# Patient Record
Sex: Female | Born: 1960 | Race: White | Hispanic: No | Marital: Married | Smoking: Never smoker
Health system: Southern US, Community
[De-identification: ages and names within clinical notes are randomized; demographics above are authoritative.]

## PROBLEM LIST (undated history)

## (undated) DIAGNOSIS — G43809 Other migraine, not intractable, without status migrainosus: Secondary | ICD-10-CM

## (undated) DIAGNOSIS — E785 Hyperlipidemia, unspecified: Secondary | ICD-10-CM

## (undated) DIAGNOSIS — C50919 Malignant neoplasm of unspecified site of unspecified female breast: Secondary | ICD-10-CM

## (undated) DIAGNOSIS — M810 Age-related osteoporosis without current pathological fracture: Secondary | ICD-10-CM

## (undated) DIAGNOSIS — G894 Chronic pain syndrome: Secondary | ICD-10-CM

## (undated) DIAGNOSIS — M5030 Other cervical disc degeneration, unspecified cervical region: Secondary | ICD-10-CM

## (undated) DIAGNOSIS — E063 Autoimmune thyroiditis: Secondary | ICD-10-CM

## (undated) DIAGNOSIS — K219 Gastro-esophageal reflux disease without esophagitis: Secondary | ICD-10-CM

## (undated) DIAGNOSIS — K589 Irritable bowel syndrome without diarrhea: Secondary | ICD-10-CM

## (undated) DIAGNOSIS — E538 Deficiency of other specified B group vitamins: Secondary | ICD-10-CM

## (undated) DIAGNOSIS — E079 Disorder of thyroid, unspecified: Secondary | ICD-10-CM

## (undated) DIAGNOSIS — E559 Vitamin D deficiency, unspecified: Secondary | ICD-10-CM

## (undated) DIAGNOSIS — E7800 Pure hypercholesterolemia, unspecified: Secondary | ICD-10-CM

## (undated) DIAGNOSIS — M797 Fibromyalgia: Secondary | ICD-10-CM

## (undated) DIAGNOSIS — C801 Malignant (primary) neoplasm, unspecified: Secondary | ICD-10-CM

## (undated) HISTORY — DX: Autoimmune thyroiditis: E06.3

## (undated) HISTORY — PX: SALPINGOOPHORECTOMY: SHX82

## (undated) HISTORY — DX: Fibromyalgia: M79.7

## (undated) HISTORY — PX: LEG SURGERY: SHX1003

## (undated) HISTORY — DX: Malignant neoplasm of unspecified site of unspecified female breast: C50.919

## (undated) HISTORY — DX: Gastro-esophageal reflux disease without esophagitis: K21.9

## (undated) HISTORY — DX: Other migraine, not intractable, without status migrainosus: G43.809

## (undated) HISTORY — DX: Malignant (primary) neoplasm, unspecified: C80.1

## (undated) HISTORY — DX: Irritable bowel syndrome, unspecified: K58.9

## (undated) HISTORY — DX: Age-related osteoporosis without current pathological fracture: M81.0

## (undated) HISTORY — DX: Chronic pain syndrome: G89.4

## (undated) HISTORY — PX: DILATATION AND CURETTAGE/HYSTEROSCOPY WITH MINERVA: SHX6851

## (undated) HISTORY — DX: Other cervical disc degeneration, unspecified cervical region: M50.30

## (undated) HISTORY — PX: ABDOMINAL HYSTERECTOMY: SHX81

## (undated) HISTORY — DX: Hyperlipidemia, unspecified: E78.5

## (undated) HISTORY — DX: Deficiency of other specified B group vitamins: E53.8

## (undated) HISTORY — DX: Disorder of thyroid, unspecified: E07.9

## (undated) HISTORY — PX: REPLACEMENT TOTAL KNEE: SUR1224

## (undated) HISTORY — DX: Vitamin D deficiency, unspecified: E55.9

## (undated) HISTORY — DX: Pure hypercholesterolemia, unspecified: E78.00

---

## 2006-12-13 DIAGNOSIS — M1990 Unspecified osteoarthritis, unspecified site: Secondary | ICD-10-CM | POA: Insufficient documentation

## 2006-12-13 HISTORY — DX: Unspecified osteoarthritis, unspecified site: M19.90

## 2007-11-17 DIAGNOSIS — C50919 Malignant neoplasm of unspecified site of unspecified female breast: Secondary | ICD-10-CM | POA: Insufficient documentation

## 2007-11-17 HISTORY — PX: MASTECTOMY: SHX3

## 2007-11-17 HISTORY — DX: Malignant neoplasm of unspecified site of unspecified female breast: C50.919

## 2007-11-17 HISTORY — PX: ABDOMINAL HYSTERECTOMY: SHX81

## 2007-11-22 DIAGNOSIS — Z1502 Genetic susceptibility to malignant neoplasm of ovary: Secondary | ICD-10-CM | POA: Insufficient documentation

## 2007-11-22 DIAGNOSIS — Z1501 Genetic susceptibility to malignant neoplasm of breast: Secondary | ICD-10-CM

## 2007-11-22 DIAGNOSIS — C50911 Malignant neoplasm of unspecified site of right female breast: Secondary | ICD-10-CM

## 2007-11-22 DIAGNOSIS — C50912 Malignant neoplasm of unspecified site of left female breast: Secondary | ICD-10-CM

## 2007-11-22 DIAGNOSIS — C50919 Malignant neoplasm of unspecified site of unspecified female breast: Secondary | ICD-10-CM | POA: Insufficient documentation

## 2007-11-22 HISTORY — DX: Genetic susceptibility to malignant neoplasm of breast: Z15.01

## 2007-11-22 HISTORY — DX: Malignant neoplasm of unspecified site of right female breast: C50.911

## 2007-11-22 HISTORY — DX: Malignant neoplasm of unspecified site of left female breast: C50.912

## 2008-04-25 DIAGNOSIS — G629 Polyneuropathy, unspecified: Secondary | ICD-10-CM

## 2008-04-25 HISTORY — DX: Polyneuropathy, unspecified: G62.9

## 2008-09-12 DIAGNOSIS — K589 Irritable bowel syndrome without diarrhea: Secondary | ICD-10-CM | POA: Insufficient documentation

## 2008-09-16 DIAGNOSIS — M797 Fibromyalgia: Secondary | ICD-10-CM | POA: Insufficient documentation

## 2008-11-16 HISTORY — PX: BREAST RECONSTRUCTION: SHX9

## 2009-01-14 DIAGNOSIS — M51369 Other intervertebral disc degeneration, lumbar region without mention of lumbar back pain or lower extremity pain: Secondary | ICD-10-CM

## 2009-01-14 DIAGNOSIS — M5136 Other intervertebral disc degeneration, lumbar region: Secondary | ICD-10-CM | POA: Insufficient documentation

## 2009-01-14 HISTORY — DX: Other intervertebral disc degeneration, lumbar region without mention of lumbar back pain or lower extremity pain: M51.369

## 2009-02-10 DIAGNOSIS — K219 Gastro-esophageal reflux disease without esophagitis: Secondary | ICD-10-CM | POA: Insufficient documentation

## 2011-09-17 DIAGNOSIS — G4486 Cervicogenic headache: Secondary | ICD-10-CM

## 2011-09-17 DIAGNOSIS — G43809 Other migraine, not intractable, without status migrainosus: Secondary | ICD-10-CM

## 2011-09-17 HISTORY — DX: Other migraine, not intractable, without status migrainosus: G43.809

## 2011-09-17 HISTORY — DX: Cervicogenic headache: G44.86

## 2012-02-11 DIAGNOSIS — Z860100 Personal history of colon polyps, unspecified: Secondary | ICD-10-CM

## 2012-02-11 HISTORY — DX: Personal history of colon polyps, unspecified: Z86.0100

## 2018-03-24 DIAGNOSIS — E063 Autoimmune thyroiditis: Secondary | ICD-10-CM | POA: Insufficient documentation

## 2018-12-13 DIAGNOSIS — L989 Disorder of the skin and subcutaneous tissue, unspecified: Secondary | ICD-10-CM

## 2018-12-13 HISTORY — DX: Disorder of the skin and subcutaneous tissue, unspecified: L98.9

## 2021-02-21 DIAGNOSIS — G894 Chronic pain syndrome: Secondary | ICD-10-CM | POA: Diagnosis not present

## 2021-02-21 DIAGNOSIS — E038 Other specified hypothyroidism: Secondary | ICD-10-CM | POA: Diagnosis not present

## 2021-02-21 DIAGNOSIS — C50919 Malignant neoplasm of unspecified site of unspecified female breast: Secondary | ICD-10-CM | POA: Diagnosis not present

## 2021-02-21 DIAGNOSIS — E559 Vitamin D deficiency, unspecified: Secondary | ICD-10-CM | POA: Diagnosis not present

## 2021-02-21 DIAGNOSIS — E538 Deficiency of other specified B group vitamins: Secondary | ICD-10-CM | POA: Diagnosis not present

## 2021-02-21 DIAGNOSIS — M797 Fibromyalgia: Secondary | ICD-10-CM | POA: Diagnosis not present

## 2021-02-21 DIAGNOSIS — E063 Autoimmune thyroiditis: Secondary | ICD-10-CM | POA: Diagnosis not present

## 2021-02-21 DIAGNOSIS — E785 Hyperlipidemia, unspecified: Secondary | ICD-10-CM | POA: Diagnosis not present

## 2021-02-21 DIAGNOSIS — Z79899 Other long term (current) drug therapy: Secondary | ICD-10-CM | POA: Diagnosis not present

## 2021-03-23 DIAGNOSIS — Z0100 Encounter for examination of eyes and vision without abnormal findings: Secondary | ICD-10-CM | POA: Diagnosis not present

## 2021-03-26 DIAGNOSIS — E038 Other specified hypothyroidism: Secondary | ICD-10-CM | POA: Diagnosis not present

## 2021-03-26 DIAGNOSIS — Z1211 Encounter for screening for malignant neoplasm of colon: Secondary | ICD-10-CM | POA: Diagnosis not present

## 2021-03-26 DIAGNOSIS — E063 Autoimmune thyroiditis: Secondary | ICD-10-CM | POA: Diagnosis not present

## 2021-03-26 DIAGNOSIS — Z01419 Encounter for gynecological examination (general) (routine) without abnormal findings: Secondary | ICD-10-CM | POA: Diagnosis not present

## 2021-03-26 DIAGNOSIS — Z6838 Body mass index (BMI) 38.0-38.9, adult: Secondary | ICD-10-CM | POA: Diagnosis not present

## 2021-04-01 DIAGNOSIS — M5136 Other intervertebral disc degeneration, lumbar region: Secondary | ICD-10-CM | POA: Diagnosis not present

## 2021-04-01 DIAGNOSIS — M797 Fibromyalgia: Secondary | ICD-10-CM | POA: Diagnosis not present

## 2021-04-01 DIAGNOSIS — M5030 Other cervical disc degeneration, unspecified cervical region: Secondary | ICD-10-CM | POA: Diagnosis not present

## 2021-04-01 DIAGNOSIS — G894 Chronic pain syndrome: Secondary | ICD-10-CM | POA: Diagnosis not present

## 2021-04-01 DIAGNOSIS — M549 Dorsalgia, unspecified: Secondary | ICD-10-CM | POA: Diagnosis not present

## 2021-04-01 DIAGNOSIS — Z1389 Encounter for screening for other disorder: Secondary | ICD-10-CM | POA: Diagnosis not present

## 2021-04-18 DIAGNOSIS — G894 Chronic pain syndrome: Secondary | ICD-10-CM | POA: Diagnosis not present

## 2021-04-18 DIAGNOSIS — M5136 Other intervertebral disc degeneration, lumbar region: Secondary | ICD-10-CM | POA: Diagnosis not present

## 2021-04-18 DIAGNOSIS — M549 Dorsalgia, unspecified: Secondary | ICD-10-CM | POA: Diagnosis not present

## 2021-04-18 DIAGNOSIS — M797 Fibromyalgia: Secondary | ICD-10-CM | POA: Diagnosis not present

## 2021-04-18 DIAGNOSIS — M5030 Other cervical disc degeneration, unspecified cervical region: Secondary | ICD-10-CM | POA: Diagnosis not present

## 2021-04-18 DIAGNOSIS — Z1389 Encounter for screening for other disorder: Secondary | ICD-10-CM | POA: Diagnosis not present

## 2021-06-23 DIAGNOSIS — M549 Dorsalgia, unspecified: Secondary | ICD-10-CM | POA: Diagnosis not present

## 2021-06-23 DIAGNOSIS — M797 Fibromyalgia: Secondary | ICD-10-CM | POA: Diagnosis not present

## 2021-06-23 DIAGNOSIS — Z1389 Encounter for screening for other disorder: Secondary | ICD-10-CM | POA: Diagnosis not present

## 2021-06-23 DIAGNOSIS — G894 Chronic pain syndrome: Secondary | ICD-10-CM | POA: Diagnosis not present

## 2021-06-23 DIAGNOSIS — M5030 Other cervical disc degeneration, unspecified cervical region: Secondary | ICD-10-CM | POA: Diagnosis not present

## 2021-06-23 DIAGNOSIS — M5136 Other intervertebral disc degeneration, lumbar region: Secondary | ICD-10-CM | POA: Diagnosis not present

## 2021-06-26 DIAGNOSIS — E063 Autoimmune thyroiditis: Secondary | ICD-10-CM | POA: Diagnosis not present

## 2021-06-26 DIAGNOSIS — E038 Other specified hypothyroidism: Secondary | ICD-10-CM | POA: Diagnosis not present

## 2021-06-26 DIAGNOSIS — Z6836 Body mass index (BMI) 36.0-36.9, adult: Secondary | ICD-10-CM | POA: Diagnosis not present

## 2021-06-26 DIAGNOSIS — K580 Irritable bowel syndrome with diarrhea: Secondary | ICD-10-CM | POA: Diagnosis not present

## 2021-07-07 ENCOUNTER — Ambulatory Visit (INDEPENDENT_AMBULATORY_CARE_PROVIDER_SITE_OTHER): Payer: Medicare HMO | Admitting: Endocrinology

## 2021-07-07 ENCOUNTER — Other Ambulatory Visit: Payer: Self-pay

## 2021-07-07 ENCOUNTER — Encounter: Payer: Self-pay | Admitting: Endocrinology

## 2021-07-07 DIAGNOSIS — E042 Nontoxic multinodular goiter: Secondary | ICD-10-CM

## 2021-07-07 DIAGNOSIS — E039 Hypothyroidism, unspecified: Secondary | ICD-10-CM

## 2021-07-07 HISTORY — DX: Hypothyroidism, unspecified: E03.9

## 2021-07-07 HISTORY — DX: Nontoxic multinodular goiter: E04.2

## 2021-07-07 LAB — T4, FREE: Free T4: 1.36 ng/dL (ref 0.60–1.60)

## 2021-07-07 LAB — TSH: TSH: 0.01 u[IU]/mL — ABNORMAL LOW (ref 0.35–5.50)

## 2021-07-07 MED ORDER — LEVOTHYROXINE SODIUM 112 MCG PO TABS: 112.0000 ug | ORAL_TABLET | Freq: Every day | ORAL | 3 refills | Status: DC

## 2021-07-07 MED ORDER — LEVOTHYROXINE SODIUM 137 MCG PO TABS: 137.0000 ug | ORAL_TABLET | Freq: Every day | ORAL | 3 refills | Status: DC

## 2021-07-07 NOTE — Patient Instructions (Addendum)
Blood tests are requested for you today.  We'll let you know about the results.   Please sign a release of information from the doctor in [facility].   Please come back for a follow-up appointment in 1 year.

## 2021-07-07 NOTE — Progress Notes (Signed)
Subjective:    Patient ID: Laura Hill, female    DOB: 24-Aug-1961, 60 y.o.   MRN: [id number]  HPI Pt is referred by Dr Aleda Grana, for hypothyroidism.  Pt reports hypothyroidism was dx'ed in 2012.  she has been on prescribed thyroid hormone therapy since dx.  she has never taken kelp or any other type of non-prescribed thyroid product. she has never had thyroid surgery, or XRT to the neck.  she has never been on amiodarone or lithium.  Pt says she had bx of 2 nodules in approx 2017, in [place].  She reports fatigue and alt C and D.  She has been on her current 137 mcg/d x 2 mos.  Since the increase, pt states she feels no different.   Past Medical History:  Diagnosis Date   Cancer ([place])    Osteoporosis    Thyroid disease    Social History   Socioeconomic History   Marital status: Married    Spouse name: Not on file   Number of children: Not on file   Years of education: Not on file   Highest education level: Not on file  Occupational History   Not on file  Tobacco Use   Smoking status: Never   Smokeless tobacco: Never  Substance and Sexual Activity   Alcohol use: Never   Drug use: Never   Sexual activity: Not on file  Other Topics Concern   Not on file  Social History Narrative   Not on file   Social Determinants of Health   Financial Resource Strain: Not on file  Food Insecurity: Not on file  Transportation Needs: Not on file  Physical Activity: Not on file  Stress: Not on file  Social Connections: Not on file  Intimate Partner Violence: Not on file    Current Outpatient Medications on File Prior to Visit  Medication Sig Dispense Refill   dicyclomine (BENTYL) 20 MG tablet      DULoxetine (CYMBALTA) 60 MG capsule      linaCLOtide (LINZESS PO) Take by mouth.     omeprazole (PRILOSEC) 40 MG capsule      pregabalin (LYRICA) 100 MG capsule      rosuvastatin (CRESTOR) 20 MG tablet      tiZANidine (ZANAFLEX) 4 MG tablet      No current facility-administered medications on  file prior to visit.    Allergies  Allergen Reactions   Bactrim [Sulfamethoxazole-Trimethoprim] Hives    No family history on file.  BP 122/86   Pulse 92   Ht '5\' 6"'$  (1.676 m)   Wt 237 lb (107.5 kg)   SpO2 98%   BMI 38.25 kg/m     Review of Systems denies depression, numbness, cold intolerance, and dry skin.  She has lost 16 lbs x a few mos.      Objective:   Physical Exam VS: see vs page GEN: no distress HEAD: head: no deformity eyes: no periorbital swelling, no proptosis external nose and ears are normal NECK: supple, thyroid is not enlarged CHEST WALL: no deformity LUNGS: clear to auscultation CV: reg rate and rhythm, no murmur.  MUSCULOSKELETAL: gait is normal and steady EXTEMITIES: no deformity.  no leg edema NEURO:  readily moves all 4's.  sensation is intact to touch on all 4's SKIN:  Normal texture and temperature.  No rash or suspicious lesion is visible.   NODES:  None palpable at the neck.   PSYCH: alert, well-oriented.  Does not appear anxious nor depressed.  Lab Results  Component Value Date   TSH 0.01 Repeated and verified X2. (L) 07/07/2021       Assessment & Plan:  Hypothyroidism, overcontrolled.  Reduce synthroid MNG: records are requested  Patient Instructions  Blood tests are requested for you today.  We'll let you know about the results.   Please sign a release of information from the doctor in [facility].   Please come back for a follow-up appointment in 1 year.

## 2021-07-14 DIAGNOSIS — K219 Gastro-esophageal reflux disease without esophagitis: Secondary | ICD-10-CM | POA: Diagnosis not present

## 2021-07-14 DIAGNOSIS — Z6836 Body mass index (BMI) 36.0-36.9, adult: Secondary | ICD-10-CM | POA: Diagnosis not present

## 2021-07-14 DIAGNOSIS — E063 Autoimmune thyroiditis: Secondary | ICD-10-CM | POA: Diagnosis not present

## 2021-07-14 DIAGNOSIS — M797 Fibromyalgia: Secondary | ICD-10-CM | POA: Diagnosis not present

## 2021-07-14 DIAGNOSIS — E785 Hyperlipidemia, unspecified: Secondary | ICD-10-CM | POA: Diagnosis not present

## 2021-07-14 DIAGNOSIS — E038 Other specified hypothyroidism: Secondary | ICD-10-CM | POA: Diagnosis not present

## 2021-07-14 DIAGNOSIS — K580 Irritable bowel syndrome with diarrhea: Secondary | ICD-10-CM | POA: Diagnosis not present

## 2021-08-21 DIAGNOSIS — M5416 Radiculopathy, lumbar region: Secondary | ICD-10-CM | POA: Diagnosis not present

## 2021-08-21 DIAGNOSIS — M797 Fibromyalgia: Secondary | ICD-10-CM | POA: Diagnosis not present

## 2021-08-21 DIAGNOSIS — G894 Chronic pain syndrome: Secondary | ICD-10-CM | POA: Diagnosis not present

## 2021-08-21 DIAGNOSIS — M5030 Other cervical disc degeneration, unspecified cervical region: Secondary | ICD-10-CM | POA: Diagnosis not present

## 2021-08-21 DIAGNOSIS — M5136 Other intervertebral disc degeneration, lumbar region: Secondary | ICD-10-CM | POA: Diagnosis not present

## 2021-10-13 ENCOUNTER — Encounter: Payer: Self-pay | Admitting: Endocrinology

## 2021-10-14 DIAGNOSIS — E040 Nontoxic diffuse goiter: Secondary | ICD-10-CM | POA: Diagnosis not present

## 2021-10-14 DIAGNOSIS — R509 Fever, unspecified: Secondary | ICD-10-CM | POA: Diagnosis not present

## 2021-10-14 DIAGNOSIS — I75023 Atheroembolism of bilateral lower extremities: Secondary | ICD-10-CM | POA: Diagnosis not present

## 2021-10-14 DIAGNOSIS — E038 Other specified hypothyroidism: Secondary | ICD-10-CM | POA: Diagnosis not present

## 2021-10-14 DIAGNOSIS — C50919 Malignant neoplasm of unspecified site of unspecified female breast: Secondary | ICD-10-CM | POA: Diagnosis not present

## 2021-10-14 DIAGNOSIS — E063 Autoimmune thyroiditis: Secondary | ICD-10-CM | POA: Diagnosis not present

## 2021-10-14 DIAGNOSIS — Z6836 Body mass index (BMI) 36.0-36.9, adult: Secondary | ICD-10-CM | POA: Diagnosis not present

## 2021-10-14 DIAGNOSIS — R590 Localized enlarged lymph nodes: Secondary | ICD-10-CM | POA: Diagnosis not present

## 2021-10-15 ENCOUNTER — Ambulatory Visit (INDEPENDENT_AMBULATORY_CARE_PROVIDER_SITE_OTHER): Payer: Medicare HMO | Admitting: Gastroenterology

## 2021-10-15 ENCOUNTER — Encounter: Payer: Self-pay | Admitting: Gastroenterology

## 2021-10-15 ENCOUNTER — Telehealth: Payer: Self-pay

## 2021-10-15 VITALS — BP 124/88 | HR 87 | Ht 66.0 in | Wt 237.5 lb

## 2021-10-15 DIAGNOSIS — Z800 Family history of malignant neoplasm of digestive organs: Secondary | ICD-10-CM | POA: Diagnosis not present

## 2021-10-15 DIAGNOSIS — K589 Irritable bowel syndrome without diarrhea: Secondary | ICD-10-CM

## 2021-10-15 DIAGNOSIS — Z8719 Personal history of other diseases of the digestive system: Secondary | ICD-10-CM | POA: Diagnosis not present

## 2021-10-15 DIAGNOSIS — R634 Abnormal weight loss: Secondary | ICD-10-CM

## 2021-10-15 DIAGNOSIS — R1031 Right lower quadrant pain: Secondary | ICD-10-CM | POA: Diagnosis not present

## 2021-10-15 DIAGNOSIS — R1032 Left lower quadrant pain: Secondary | ICD-10-CM | POA: Diagnosis not present

## 2021-10-15 DIAGNOSIS — K219 Gastro-esophageal reflux disease without esophagitis: Secondary | ICD-10-CM | POA: Diagnosis not present

## 2021-10-15 DIAGNOSIS — R1310 Dysphagia, unspecified: Secondary | ICD-10-CM

## 2021-10-15 NOTE — Patient Instructions (Addendum)
If you are age 60 or older, your body mass index should be between 23-30. Your Body mass index is 38.33 kg/m. If this is out of the aforementioned range listed, please consider follow up with your Primary Care Provider.  If you are age 2 or younger, your body mass index should be between 19-25. Your Body mass index is 38.33 kg/m. If this is out of the aformentioned range listed, please consider follow up with your Primary Care Provider.   ________________________________________________________  The [facility] providers would like to encourage you to use [facility] to communicate with providers for non-urgent requests or questions.  Due to long hold times on the telephone, sending your provider a message by [facility] may be a faster and more efficient way to get a response.  Please allow 48 business hours for a response.  Please remember that this is for non-urgent requests.  _______________________________________________________  Dennis Bast have been scheduled for an endoscopy and colonoscopy. Please follow the written instructions given to you at your visit today. Please pick up your prep supplies at the pharmacy within the next 1-3 days. If you use inhalers (even only as needed), please bring them with you on the day of your procedure.  Two days before your procedure: Mix 3 packs (or capfuls) of Miralax in 48 ounces of clear liquid and drink at 6pm.  Please go to the lab on the 2nd floor [address] before you leave the office today.  Continue omeprazole   Call in 3 days to schedule your CT You have been scheduled for a CT scan of the abdomen and pelvis at [facility] [place], [place] [address] [address] Radiology).   You are scheduled on           at            . You should arrive 15 minutes prior to your appointment time for registration. Please follow the written instructions below on the day of your exam:  WARNING: IF YOU ARE ALLERGIC TO IODINE/X-RAY DYE, PLEASE  NOTIFY RADIOLOGY IMMEDIATELY AT [phone]! YOU WILL BE GIVEN A 13 HOUR PREMEDICATION PREP.  1) Do not eat or drink anything after          (4 hours prior to your test) 2) You have been given 2 bottles of oral contrast to drink. The solution may taste better if refrigerated, but do NOT add ice or any other liquid to this solution. Shake well before drinking.    Drink 1 bottle of contrast @           (2 hours prior to your exam)  Drink 1 bottle of contrast @           (1 hour prior to your exam)  You may take any medications as prescribed with a small amount of water, if necessary. If you take any of the following medications: METFORMIN, GLUCOPHAGE, GLUCOVANCE, AVANDAMET, RIOMET, FORTAMET, [place] MET, JANUMET, GLUMETZA or METAGLIP, you MAY be asked to HOLD this medication 48 hours AFTER the exam.  The purpose of you drinking the oral contrast is to aid in the visualization of your intestinal tract. The contrast solution may cause some diarrhea. Depending on your individual set of symptoms, you may also receive an intravenous injection of x-ray contrast/dye. Plan on being at [facility] for 30 minutes or longer, depending on the type of exam you are having performed.  This test typically takes 30-45 minutes to complete.  If  you have any questions regarding your exam or if you need to reschedule, you may call the CT department at [phone] between the hours of 8:00 am and 5:00 pm, Monday-Friday.  ________________________________________________________________________  Thank you,  Dr. Jackquline Denmark

## 2021-10-15 NOTE — Telephone Encounter (Signed)
CT has been authorized and patient is aware she can call now to schedule the CT

## 2021-10-15 NOTE — Progress Notes (Signed)
Chief Complaint:   Referring Provider:  [name], MD      ASSESSMENT AND PLAN;   #1. GERD with H/O HH with occ dysphagia. Neg prev Bx for EoE/Barrett's.  #2. IBS with alternating diarrhea and constipation.  #3. FH colon Ca (mom at age 60). H/O colonic polyps.   #4. RLQ/LLQ pain with wt loss   Plan: -CBC, CMP -CT AP with p.o. and IV contrast -EGD with dil/colon in Jan/feb 2023 -Continue omeprazole 40 mg p.o. once a day -Non-pharmacologic means of reflux control.   I discussed EGD/Colonoscopy- the indications, risks, alternatives and potential complications including, but not limited to, bleeding, infection, reaction to medication, damage to internal organs, cardiac and/or pulmonary problems, and perforation requiring surgery (1 to 2 in 1000). The possibility that significant findings could be missed was explained. All ? were answered. The patient gives consent to proceed. HPI:    Laura Hill is a 60 y.o. female  With history of breast cancer (BRCA 2+, s/p B/L mastectomy, hysterectomy), fibromyalgia, GERD, IC, IBS, obesity, HLD, hypothyroidism, chronic headaches, OSA,   C/O IBS Dx 2009, alt diarrhea and constipation with bloating.  When she gets constipated she will take Linzess 290 mcg.she rarely uses Imodium.  Has associated lower abdominal pain which gets better with defecation.  Recently had wt loss 20lb over 1 year.  Occ rectal bleeding d/t hoids.  Better with Preparation H.  Longstanding history of gastroesophageal reflux, better with omeprazole.  She does have intermittent solid food dysphagia.  Had previous several esophageal dilatations as below.  Would like to get dilation performed  Family history of colon cancer -Mom at age 33, [place] at 59, MM  Braca2 +.  Lynch was negative.  Past GI work-up (Dr Kendrick Fries at [facility]): Colonoscopy 11/27/2018 ([facility]): Mild diverticulosis, grade 1 internal hemorrhoids, strong family history of colon  cancer.  Repeat in 3 years. Colonoscopy 07/13/2017 grade 2 internal hemorrhoids s/p banding, mild diverticulosis, neg colon biopsies. Colonoscopy 09/03/2015 mild diverticulosis, internal hemorrhoids. Colonoscopy 02/2013 8 mm cecal polyp tubulovillous adenoma, ascending colon 6 mm polyp tubular adenoma. EGD 07/13/2017: Mild gastritis, fundic gland polyps.  Dilation empiric 54Fr (better) EGD 08/24/2015: Empiric 54 Fr, no Barrett's or EOE.  No obvious hiatal hernia. EGD 02/22/2013 LA grade a EE, 54Fr Past Medical History:  Diagnosis Date   Breast cancer ([place])    Cancer ([place])    Osteoporosis    Thyroid disease     History reviewed. No pertinent surgical history.  Family History  Problem Relation Age of Onset   Colon cancer Mother    Multiple myeloma Mother    Prostate cancer Father    Colon cancer Maternal Uncle    Breast cancer Paternal Aunt    Breast cancer Paternal Uncle    Stomach cancer Paternal Uncle    Breast cancer Paternal Grandmother     Social History   Tobacco Use   Smoking status: Never   Smokeless tobacco: Never  Vaping Use   Vaping Use: Never used  Substance Use Topics   Alcohol use: Never   Drug use: Never    Current Outpatient Medications  Medication Sig Dispense Refill   dicyclomine (BENTYL) 20 MG tablet      DULoxetine (CYMBALTA) 60 MG capsule      levothyroxine (SYNTHROID) 112 MCG tablet Take 1 tablet (112 mcg total) by mouth daily. 90 tablet 3   linaCLOtide (LINZESS PO) Take by mouth.     omeprazole (PRILOSEC)  40 MG capsule      pregabalin (LYRICA) 100 MG capsule      rosuvastatin (CRESTOR) 20 MG tablet      tiZANidine (ZANAFLEX) 4 MG tablet      No current facility-administered medications for this visit.    Allergies  Allergen Reactions   Bactrim [Sulfamethoxazole-Trimethoprim] Hives    Review of Systems:  Constitutional: Denies fever, chills, diaphoresis, appetite change and has fatigue.  HEENT: Denies photophobia, eye pain, redness, hearing  loss, ear pain, congestion, sore throat, rhinorrhea, sneezing, mouth sores, neck pain, neck stiffness and tinnitus.   Respiratory: Denies SOB, DOE, cough, chest tightness,  and wheezing.  Has occasional cough. Cardiovascular: Denies chest pain, palpitations and leg swelling.  Genitourinary: Denies dysuria, urgency, frequency, hematuria, flank pain and difficulty urinating.  Has excessive urination and urine leakage at times. Musculoskeletal: Denies myalgias, has back pain, joint swelling, arthralgias and gait problem.  Skin: No rash.  Neurological: Denies dizziness, seizures, syncope, weakness, light-headedness, numbness and has headaches.  Hematological: Denies adenopathy. Easy bruising, personal or family bleeding history  Psychiatric/Behavioral: has anxiety or depression     Physical Exam:    BP 124/88 (BP Location: Left Leg, Patient Position: Sitting, Cuff Size: Normal) Comment (BP Location): pedal  Pulse 87   Ht _0  (1.676 m)   Wt 237 lb 8 oz (107.7 kg)   SpO2 98%   BMI 38.33 kg/m  Wt Readings from Last 3 Encounters:  10/15/21 237 lb 8 oz (107.7 kg)  07/07/21 237 lb (107.5 kg)   Constitutional:  Well-developed, in no acute distress. Psychiatric: Normal mood and affect. Behavior is normal. HEENT: Pupils normal.  Conjunctivae are normal. No scleral icterus. Neck supple.  Cardiovascular: Normal rate, regular rhythm. No edema Pulmonary/chest: Effort normal and breath sounds normal. No wheezing, rales or rhonchi. Abdominal: Soft, nondistended. Nontender. Bowel sounds active throughout. There are no masses palpable. No hepatomegaly. Rectal: Deferred Neurological: Alert and oriented to person place and time. Skin: Skin is warm and dry. No rashes noted.  Labs reviewed  From 02/2021 -B12 253 -TSH 1.5 -Normal CMP with AST 17, ALT 15, albumin 4.2, normal BUN/creatinine -Normal CBC with hemoglobin 14.5, MCV 83.  Carmell Austria, MD 10/15/2021, 9:38 AM  Cc: [name],  MD

## 2021-10-17 ENCOUNTER — Telehealth: Payer: Self-pay

## 2021-10-17 DIAGNOSIS — K589 Irritable bowel syndrome without diarrhea: Secondary | ICD-10-CM

## 2021-10-17 DIAGNOSIS — K219 Gastro-esophageal reflux disease without esophagitis: Secondary | ICD-10-CM

## 2021-10-17 DIAGNOSIS — Z8719 Personal history of other diseases of the digestive system: Secondary | ICD-10-CM

## 2021-10-17 DIAGNOSIS — R634 Abnormal weight loss: Secondary | ICD-10-CM

## 2021-10-17 NOTE — Telephone Encounter (Signed)
CMP placed for patient to have at PCP on 10-20-2021. Fax number [phone]. Fax said it was successful

## 2021-10-17 NOTE — Telephone Encounter (Signed)
Patient made aware.

## 2021-10-20 DIAGNOSIS — K219 Gastro-esophageal reflux disease without esophagitis: Secondary | ICD-10-CM | POA: Diagnosis not present

## 2021-10-21 DIAGNOSIS — E034 Atrophy of thyroid (acquired): Secondary | ICD-10-CM | POA: Diagnosis not present

## 2021-10-21 DIAGNOSIS — E063 Autoimmune thyroiditis: Secondary | ICD-10-CM | POA: Diagnosis not present

## 2021-10-21 DIAGNOSIS — Z1389 Encounter for screening for other disorder: Secondary | ICD-10-CM | POA: Diagnosis not present

## 2021-10-21 DIAGNOSIS — M797 Fibromyalgia: Secondary | ICD-10-CM | POA: Diagnosis not present

## 2021-10-21 DIAGNOSIS — M5030 Other cervical disc degeneration, unspecified cervical region: Secondary | ICD-10-CM | POA: Diagnosis not present

## 2021-10-21 DIAGNOSIS — M549 Dorsalgia, unspecified: Secondary | ICD-10-CM | POA: Diagnosis not present

## 2021-10-21 DIAGNOSIS — R590 Localized enlarged lymph nodes: Secondary | ICD-10-CM | POA: Diagnosis not present

## 2021-10-21 DIAGNOSIS — E041 Nontoxic single thyroid nodule: Secondary | ICD-10-CM | POA: Diagnosis not present

## 2021-10-21 DIAGNOSIS — M5136 Other intervertebral disc degeneration, lumbar region: Secondary | ICD-10-CM | POA: Diagnosis not present

## 2021-10-21 DIAGNOSIS — G894 Chronic pain syndrome: Secondary | ICD-10-CM | POA: Diagnosis not present

## 2021-10-21 DIAGNOSIS — E040 Nontoxic diffuse goiter: Secondary | ICD-10-CM | POA: Diagnosis not present

## 2021-10-22 ENCOUNTER — Encounter (HOSPITAL_BASED_OUTPATIENT_CLINIC_OR_DEPARTMENT_OTHER): Payer: Self-pay

## 2021-10-22 ENCOUNTER — Other Ambulatory Visit: Payer: Self-pay

## 2021-10-22 ENCOUNTER — Ambulatory Visit (HOSPITAL_BASED_OUTPATIENT_CLINIC_OR_DEPARTMENT_OTHER)
Admission: RE | Admit: 2021-10-22 | Discharge: 2021-10-22 | Disposition: A | Discharge status: Home / Self Care | Payer: Medicare HMO | Source: Ambulatory Visit | Attending: Gastroenterology | Admitting: Gastroenterology

## 2021-10-22 DIAGNOSIS — Z800 Family history of malignant neoplasm of digestive organs: Secondary | ICD-10-CM | POA: Diagnosis not present

## 2021-10-22 DIAGNOSIS — Z8719 Personal history of other diseases of the digestive system: Secondary | ICD-10-CM | POA: Diagnosis not present

## 2021-10-22 DIAGNOSIS — R109 Unspecified abdominal pain: Secondary | ICD-10-CM | POA: Diagnosis not present

## 2021-10-22 DIAGNOSIS — R1031 Right lower quadrant pain: Secondary | ICD-10-CM | POA: Insufficient documentation

## 2021-10-22 DIAGNOSIS — R1310 Dysphagia, unspecified: Secondary | ICD-10-CM | POA: Diagnosis not present

## 2021-10-22 DIAGNOSIS — K219 Gastro-esophageal reflux disease without esophagitis: Secondary | ICD-10-CM | POA: Diagnosis not present

## 2021-10-22 DIAGNOSIS — K589 Irritable bowel syndrome without diarrhea: Secondary | ICD-10-CM | POA: Insufficient documentation

## 2021-10-22 DIAGNOSIS — R634 Abnormal weight loss: Secondary | ICD-10-CM | POA: Diagnosis not present

## 2021-10-22 DIAGNOSIS — K760 Fatty (change of) liver, not elsewhere classified: Secondary | ICD-10-CM | POA: Diagnosis not present

## 2021-10-22 DIAGNOSIS — R1032 Left lower quadrant pain: Secondary | ICD-10-CM | POA: Insufficient documentation

## 2021-10-22 MED ORDER — IOHEXOL 300 MG/ML  SOLN
100.0000 mL | Freq: Once | INTRAMUSCULAR | Status: AC | PRN
  Administered 2021-10-22: 100 mL via INTRAVENOUS

## 2021-10-27 ENCOUNTER — Other Ambulatory Visit: Payer: Self-pay

## 2021-10-27 DIAGNOSIS — R109 Unspecified abdominal pain: Secondary | ICD-10-CM

## 2021-10-27 DIAGNOSIS — N824 Other female intestinal-genital tract fistulae: Secondary | ICD-10-CM

## 2021-10-29 DIAGNOSIS — R109 Unspecified abdominal pain: Secondary | ICD-10-CM | POA: Diagnosis not present

## 2021-11-04 DIAGNOSIS — N3281 Overactive bladder: Secondary | ICD-10-CM | POA: Diagnosis not present

## 2021-11-04 DIAGNOSIS — N823 Fistula of vagina to large intestine: Secondary | ICD-10-CM | POA: Diagnosis not present

## 2021-11-04 DIAGNOSIS — C50919 Malignant neoplasm of unspecified site of unspecified female breast: Secondary | ICD-10-CM | POA: Diagnosis not present

## 2021-11-04 DIAGNOSIS — Z6836 Body mass index (BMI) 36.0-36.9, adult: Secondary | ICD-10-CM | POA: Diagnosis not present

## 2021-11-07 ENCOUNTER — Other Ambulatory Visit: Payer: Self-pay

## 2021-11-07 ENCOUNTER — Ambulatory Visit (HOSPITAL_COMMUNITY)
Admission: RE | Admit: 2021-11-07 | Discharge: 2021-11-07 | Disposition: A | Discharge status: Home / Self Care | Payer: Medicare HMO | Source: Ambulatory Visit | Attending: Gastroenterology | Admitting: Gastroenterology

## 2021-11-07 DIAGNOSIS — N824 Other female intestinal-genital tract fistulae: Secondary | ICD-10-CM | POA: Diagnosis not present

## 2021-11-07 DIAGNOSIS — Z90710 Acquired absence of both cervix and uterus: Secondary | ICD-10-CM | POA: Diagnosis not present

## 2021-11-07 DIAGNOSIS — K5730 Diverticulosis of large intestine without perforation or abscess without bleeding: Secondary | ICD-10-CM | POA: Diagnosis not present

## 2021-11-07 MED ORDER — GADOBUTROL 1 MMOL/ML IV SOLN
10.0000 mL | Freq: Once | INTRAVENOUS | Status: AC | PRN
  Administered 2021-11-07: 11:00:00 10 mL via INTRAVENOUS

## 2021-11-14 ENCOUNTER — Telehealth: Payer: Self-pay | Admitting: *Deleted

## 2021-11-14 NOTE — Telephone Encounter (Signed)
Patient requesting Transfer of Care for Hx: Breast CA.  Appt made 12/05/2021 at 3:00 pm.  In the process of retrieving Medical Records

## 2021-11-25 ENCOUNTER — Encounter: Payer: Self-pay | Admitting: Gastroenterology

## 2021-11-25 ENCOUNTER — Ambulatory Visit (INDEPENDENT_AMBULATORY_CARE_PROVIDER_SITE_OTHER): Payer: Medicare HMO | Admitting: Gastroenterology

## 2021-11-25 ENCOUNTER — Other Ambulatory Visit: Payer: Self-pay

## 2021-11-25 VITALS — BP 130/86 | HR 93 | Ht 66.0 in | Wt 239.0 lb

## 2021-11-25 DIAGNOSIS — Z800 Family history of malignant neoplasm of digestive organs: Secondary | ICD-10-CM

## 2021-11-25 DIAGNOSIS — R1032 Left lower quadrant pain: Secondary | ICD-10-CM

## 2021-11-25 DIAGNOSIS — K219 Gastro-esophageal reflux disease without esophagitis: Secondary | ICD-10-CM

## 2021-11-25 DIAGNOSIS — Z8719 Personal history of other diseases of the digestive system: Secondary | ICD-10-CM | POA: Diagnosis not present

## 2021-11-25 DIAGNOSIS — R634 Abnormal weight loss: Secondary | ICD-10-CM | POA: Diagnosis not present

## 2021-11-25 DIAGNOSIS — K582 Mixed irritable bowel syndrome: Secondary | ICD-10-CM | POA: Diagnosis not present

## 2021-11-25 DIAGNOSIS — R1031 Right lower quadrant pain: Secondary | ICD-10-CM | POA: Diagnosis not present

## 2021-11-25 NOTE — Patient Instructions (Addendum)
If you are age 61 or older, your body mass index should be between 23-30. Your Body mass index is 38.58 kg/m. If this is out of the aforementioned range listed, please consider follow up with your Primary Care Provider.  If you are age 27 or younger, your body mass index should be between 19-25. Your Body mass index is 38.58 kg/m. If this is out of the aformentioned range listed, please consider follow up with your Primary Care Provider.   ________________________________________________________  The [facility] providers would like to encourage you to use [facility] to communicate with providers for non-urgent requests or questions.  Due to long hold times on the telephone, sending your provider a message by [facility] may be a faster and more efficient way to get a response.  Please allow 48 business hours for a response.  Please remember that this is for non-urgent requests.  _______________________________________________________  Continue omeprazole.  Keep appointment for EGD/Colon  Thank you,  Dr. Jackquline Denmark

## 2021-11-25 NOTE — Progress Notes (Signed)
Chief Complaint: FU  Referring Provider:  [name], MD      ASSESSMENT AND PLAN;   #1. GERD with H/O HH with occ dysphagia. Neg prev Bx for EoE/Barrett's.  #2. IBS with alternating diarrhea and constipation.  #3. FH colon Ca (mom at age 61). H/O colonic polyps.   #4. RLQ/LLQ pain with wt loss   Plan:  -EGD with dil/colon. -Continue omeprazole 40 mg p.o. once a day -Non-pharmacologic means of reflux control.   I discussed EGD/Colonoscopy- the indications, risks, alternatives and potential complications including, but not limited to, bleeding, infection, reaction to medication, damage to internal organs, cardiac and/or pulmonary problems, and perforation requiring surgery. The possibility that significant findings could be missed was explained. All ? were answered. The patient gives consent to proceed. HPI:    Laura Hill is a 61 y.o. female  With history of breast cancer (BRCA 2+, s/p B/L mastectomy, hysterectomy), fibromyalgia, GERD, IC, IBS, obesity, HLD, hypothyroidism, chronic headaches, OSA,   C/O IBS Dx 2009, alt diarrhea and constipation with bloating.  When she gets constipated she will take Linzess 290 mcg.she rarely uses Imodium.  Has associated lower abdominal pain which gets better with defecation.  Recently had wt loss 20lb over 1 year.  Occ rectal bleeding d/t hoids.  Better with Preparation H.  Longstanding history of gastroesophageal reflux, better with omeprazole.  She does have intermittent solid food dysphagia.  Had previous several esophageal dilatations as below.  Would like to get dilation performed  Family history of colon cancer -Mom at age 80, [place] at 17, MM  Braca2 +.  Lynch was negative.  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  MRI did not show any evidence of fistula.  She is not having frequent UTIs.  No history of pneumaturia.  She also had fatty liver on CT Abdo/pelvis.  Normal LFTs.  No  alcohol.  Wt Readings from Last 3 Encounters:  11/25/21 239 lb (108.4 kg)  10/15/21 237 lb 8 oz (107.7 kg)  07/07/21 237 lb (107.5 kg)     Past GI work-up  CT AP with contrast 10/22/2021 1. Extensive sigmoid colonic diverticulosis with a portion of sigmoid colon intimately associated with the left vaginal cuff, with gas and trace hyperdense material in the vagina. 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 Consider more definitive characterization with CT abdomen and pelvis with IV and per rectal contrast material. No evidence of acute diverticulitis. 2. Diffuse hepatic steatosis. 3.  Aortic Atherosclerosis (ICD10-I70.0).  MRI: Pelvis 11/07/2021 IMPRESSION: Sigmoid diverticulosis, without evidence of active diverticulitis.   No definite evidence of colovaginal fistula. If there is persistent clinical suspicion, consider pelvis CT with rectal contrast administration due to its higher sensitivity.     Past GI work-up (Dr Kendrick Fries at [facility]): [facility]oscopy [facility]/27/2018 ([facility]): Mild diverticulosis, grade 1 internal hemorrhoids, strong family history of colon cancer.  Repeat in 3 years. Colonoscopy 07/13/2017 grade 2 internal hemorrhoids s/p banding, mild diverticulosis, neg colon biopsies. Colonoscopy 09/03/2015 mild diverticulosis, internal hemorrhoids. Colonoscopy 02/2013 8 mm cecal polyp tubulovillous adenoma, ascending colon 6 mm polyp tubular adenoma. EGD 07/13/2017: Mild gastritis, fundic gland polyps.  Dilation empiric 54Fr (better) EGD 08/24/2015: Empiric 54 Fr, no Barrett's or EOE.  No obvious hiatal hernia. EGD 02/22/2013 LA grade a EE, 54Fr Past Medical History:  Diagnosis Date   Breast cancer ([place])    Cancer ([place])    Osteoporosis    Thyroid disease  History reviewed. No pertinent surgical history.  Family History  Problem Relation Age of Onset   Colon cancer Mother    Multiple myeloma Mother    Prostate cancer Father    Colon cancer Maternal  Uncle    Breast cancer Paternal Aunt    Breast cancer Paternal Uncle    Stomach cancer Paternal Uncle    Breast cancer Paternal Grandmother     Social History   Tobacco Use   Smoking status: Never   Smokeless tobacco: Never  Vaping Use   Vaping Use: Never used  Substance Use Topics   Alcohol use: Never   Drug use: Never    Current Outpatient Medications  Medication Sig Dispense Refill   dicyclomine (BENTYL) 20 MG tablet      DULoxetine (CYMBALTA) 60 MG capsule      levothyroxine (SYNTHROID) 112 MCG tablet Take 1 tablet (112 mcg total) by mouth daily. 90 tablet 3   linaCLOtide (LINZESS PO) Take by mouth.     omeprazole (PRILOSEC) 40 MG capsule      pregabalin (LYRICA) 100 MG capsule      rosuvastatin (CRESTOR) 20 MG tablet      tiZANidine (ZANAFLEX) 4 MG tablet      No current facility-administered medications for this visit.    Allergies  Allergen Reactions   Bactrim [Sulfamethoxazole-Trimethoprim] Hives   ROS: Psychiatric/Behavioral: has anxiety or depression     Physical Exam:    BP 130/86    Pulse 93    Ht '5\' 6"'  (1.676 m)    Wt 239 lb (108.4 kg)    SpO2 96%    BMI 38.58 kg/m  Wt Readings from Last 3 Encounters:  11/25/21 239 lb (108.4 kg)  10/15/21 237 lb 8 oz (107.7 kg)  07/07/21 237 lb (107.5 kg)   Constitutional:  Well-developed, in no acute distress. Psychiatric: Normal mood and affect. Behavior is normal. HEENT: Pupils normal.  Conjunctivae are normal. No scleral icterus. Cardiovascular: Normal rate, regular rhythm. No edema Pulmonary/chest: Effort normal and breath sounds normal. No wheezing, rales or rhonchi. Abdominal: Soft, nondistended. Nontender. Bowel sounds active throughout. There are no masses palpable. No hepatomegaly. Rectal: Deferred Neurological: Alert and oriented to person place and time. Skin: Skin is warm and dry. No rashes noted.  Labs reviewed  From 02/2021 -B12 253 -TSH 1.5 -Normal CMP with AST 17, ALT 15, albumin 4.2,  normal BUN/creatinine -Normal CBC with hemoglobin 14.5, MCV 83.  CT/MRI reviewed independently  Carmell Austria, MD 11/25/2021, 1:46 PM  Cc: [name], MD

## 2021-12-04 NOTE — Progress Notes (Signed)
[facility]  [address] [place],  [place]  [address] [phone]  Clinic Day:  12/05/2021  Referring physician: [name], MD  This document serves as a record of services personally performed by [name], MD. It was created on their behalf by Curry,Lauren E, a trained medical scribe. The creation of this record is based on the scribe's personal observations and the provider's statements to them.  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 She received dose dense AC followed by weekly Taxol as well as endocrine therapy for a total of 10 years. She has undergone bilateral mastectomies with reconstruction. She remains without evidence of obvious recurrence.  BRCA2 mutation. Therefore, she has undergone bilateral mastectomies as well as hysterectomy and bilateral salpingo oophorectomy.  Poor appetite, weight loss and nausea. She is scheduled for further GI evaluation with Dr. [name] in the near future.   Tiny hypodense lesion of the left lobe of the liver, which is too small to accurately characterize, but statistically likely to reflect cyst.   This is  a pleasant 61 year old female with a history of bilateral stage IIA breast cancers, diagnosed in 2009, status post bilateral mastectomies. She was treated with chemotherapy as well as 10 years of endocrine therapy. She remains without obvious evidence of recurrence. In view of her BRCA2 mutation, I will see her back in 6 months with CBC and CMP for repeat evaluation. For completeness, I will order CEA, CA 125 and CA 27.29. We discussed that we will likely repeat a CT and/or MRI scan later in the year to follow up on the liver lesion. She understand and agrees with this plan of care. I have answered her questions and she knows to  call with any concerns.  Thank you for the opportunity to participate in the care of your patients  I provided 50 minutes of face-to-face time during this this encounter and > 50% was spent counseling as documented under my assessment and plan.    [name], MD [facility] [address] [place] [address] Dept: [phone] Dept Fax: [phone]    CHIEF COMPLAINT:  CC: History of bilateral stage IIA breast cancers; BRCA2 positive  Current Treatment:  Surveillance   HISTORY OF PRESENT ILLNESS:  [place] is a 61 y.o. female who presents as a transfer of care for the continued evaluation and management due to her history of bilateral stage IIA breast cancers, (T2 N0 M0) on the right and (T1 N1 M0) on the left. 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 This was originally diagnosed in 2009. She did receive dose dense AC followed by 12 weeks of weekly Taxol. Due to her BRCA2 positivity, she underwent bilateral mastectomies as well as hysterectomy and bilateral salpingo oophorectomy. She also received endocrine therapy with tamoxifen for 3 years followed by 7 years of letrozole. She did receive Boneva for osteopenia while on letrozole, and most recent bone density from May 2022 was normal. She did undergo bilateral breast reconstructions in 2010.  Lab evaluation from December 2022 revealed a normal CBC and CMP was unremarkable. 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 There is a tiny hypodense lesion of the left lobe of the liver, which is too small to accurately characterize, but statistically likely to reflect cyst. I reviewed the images with the patient and have  difficulty even seeing this lesion. MRI pelvis from December 23rd revealed sigmoid diverticulosis, without evidence of active diverticulitis. No definite evidence of colovaginal fistula.   INTERVAL HISTORY:  I have reviewed her chart and materials related to her cancer extensively and collaborated history with the patient. Summary of oncologic history is as follows: Oncology History   No history exists.  See summary above for Oncology history  Brocha reports intermittent low grade fevers every 4-6 weeks which will last for about 1 day. She notes decreased appetite, upper bilateral abdominal pain and nausea. She is scheduled for GI evaluation with Dr. [name]. She also reports right shoulder pain. She does have a hiatal hernia, GERD and previous gastric polyps and gastritis, so she is on omeprazole. She also has esophageal stenosis and has required dilatation in the past for dysphagia. She does have Hashimoto's disease and is on observation only. She does have fibromyalgia and degenerative disc disease. She has borderline sleep apnea.  She denies fever, chills or other signs of infection.  She denies nausea, vomiting, bowel issues, or abdominal pain.  She denies sore throat, cough, dyspnea, or chest pain. She is up to date on bone density scan as of May 2022, which revealed a normal exam. She was 22 when she gave birth to her 1st child.  HISTORY:   Past Medical History:  Diagnosis Date   Breast cancer ([place])    DDD (degenerative disc disease), cervical    Fibromyalgia    Hashimoto's disease    Hypercholesteremia    Irritable bowel syndrome (IBS)    Migraine, cervicogenic    Osteoporosis    Thyroid disease     Past Surgical History:  Procedure Laterality Date   ABDOMINAL HYSTERECTOMY     BREAST RECONSTRUCTION Bilateral 2010   DILATATION AND CURETTAGE/HYSTEROSCOPY WITH MINERVA     LEG SURGERY     MASTECTOMY Bilateral 2009   SALPINGOOPHORECTOMY Bilateral     Family History  Problem  Relation Age of Onset   Colon cancer Mother 28   Multiple myeloma Mother 94   Prostate cancer Father 25   Breast cancer Sister 33   Cancer Brother 58       oral   Breast cancer Brother 42   Skin cancer Maternal Aunt        40s   Colon cancer Maternal Uncle 76   Stomach cancer Maternal Uncle 75   Breast cancer Paternal Aunt 30   Breast cancer Paternal Grandmother 31   Melanoma Cousin 18       maternal    Social History:  reports that she has never smoked. She has never used smokeless tobacco. She reports that she does not drink alcohol and does not use drugs.The patient is alone today. She is married and lives at home with her spouse. She has 2 biological children and has adopted two of her grandchildren. She works, and has never been exposed to chemicals or other toxic agents.  Allergies:  Allergies  Allergen Reactions   Bactrim [Sulfamethoxazole-Trimethoprim] Hives    Facial swelling with hives     Current Medications: Current Outpatient Medications  Medication Sig Dispense Refill   amoxicillin (AMOXIL)  500 MG capsule as directed.     dicyclomine (BENTYL) 20 MG tablet Take 20 mg by mouth as directed.     DULoxetine (CYMBALTA) 60 MG capsule Take 60 mg by mouth daily.     HYDROcodone-acetaminophen (NORCO/VICODIN) 5-325 MG tablet Take 1 tablet by mouth daily as needed.     levothyroxine (SYNTHROID) 112 MCG tablet Take 1 tablet (112 mcg total) by mouth daily. 90 tablet 3   linaCLOtide (LINZESS PO) Take by mouth as needed.     omeprazole (PRILOSEC) 40 MG capsule Take 40 mg by mouth daily.     pregabalin (LYRICA) 100 MG capsule Take 100 mg by mouth daily.     rosuvastatin (CRESTOR) 20 MG tablet Take 20 mg by mouth daily.     tamsulosin (FLOMAX) 0.4 MG CAPS capsule Take 0.4 mg by mouth at bedtime.     tiZANidine (ZANAFLEX) 4 MG tablet Take 4 mg by mouth daily.     No current facility-administered medications for this visit.    REVIEW OF SYSTEMS:  Review of Systems   Constitutional:  Positive for appetite change (decreased) and fever (intermittent). Negative for chills, fatigue and unexpected weight change.  HENT:  Negative.    Eyes: Negative.   Respiratory: Negative.  Negative for chest tightness, cough, hemoptysis, shortness of breath and wheezing.   Cardiovascular: Negative.  Negative for chest pain, leg swelling and palpitations.  Gastrointestinal:  Positive for abdominal pain (upper abdominal discomfort) and nausea. Negative for abdominal distention, blood in stool, constipation, diarrhea and vomiting.  Endocrine: Negative.   Genitourinary: Negative.  Negative for difficulty urinating, dysuria, frequency and hematuria.   Musculoskeletal: Negative.  Negative for arthralgias, back pain, flank pain, gait problem and myalgias.  Skin: Negative.   Neurological: Negative.  Negative for dizziness, extremity weakness, gait problem, headaches, light-headedness, numbness, seizures and speech difficulty.  Hematological: Negative.   Psychiatric/Behavioral: Negative.  Negative for depression and sleep disturbance. The patient is not nervous/anxious.      VITALS:  Blood pressure 134/89, pulse 96, temperature 98.3 F (36.8 C), temperature source Oral, resp. rate 18, height '5\' 6"'  (1.676 m), weight 240 lb 3.2 oz (109 kg), SpO2 96 %.  Wt Readings from Last 3 Encounters:  12/05/21 240 lb 3.2 oz (109 kg)  11/25/21 239 lb (108.4 kg)  10/15/21 237 lb 8 oz (107.7 kg)    Body mass index is 38.77 kg/m.  Performance status (ECOG): 1 - Symptomatic but completely ambulatory  PHYSICAL EXAM:  Physical Exam Constitutional:      General: She is not in acute distress.    Appearance: Normal appearance. She is normal weight.  HENT:     Head: Normocephalic and atraumatic.  Eyes:     General: No scleral icterus.    Extraocular Movements: Extraocular movements intact.     Conjunctiva/sclera: Conjunctivae normal.     Pupils: Pupils are equal, round, and reactive to light.   Cardiovascular:     Rate and Rhythm: Normal rate and regular rhythm.     Pulses: Normal pulses.     Heart sounds: Normal heart sounds. No murmur heard.   No friction rub. No gallop.  Pulmonary:     Effort: Pulmonary effort is normal. No respiratory distress.     Breath sounds: Normal breath sounds.  Chest:     Comments: Bilateral reconstructions are negative Abdominal:     General: Bowel sounds are normal. There is no distension.     Palpations: Abdomen is soft. There is no hepatomegaly,  splenomegaly or mass.     Tenderness: There is no abdominal tenderness.  Musculoskeletal:        General: Normal range of motion.     Cervical back: Normal range of motion and neck supple.     Right lower leg: No edema.     Left lower leg: No edema.  Lymphadenopathy:     Cervical: No cervical adenopathy.  Skin:    General: Skin is warm and dry.     Comments: Raised fleshy appearing lesion 1-2 cm of the right shoulder with slight keratosis of the center  Neurological:     General: No focal deficit present.     Mental Status: She is alert and oriented to person, place, and time. Mental status is at baseline.  Psychiatric:        Mood and Affect: Mood normal.        Behavior: Behavior normal.        Thought Content: Thought content normal.        Judgment: Judgment normal.     LABS:  No flowsheet data found. No flowsheet data found.   No results found for: CEA1 / No results found for: CEA1 No results found for: PSA1 No results found for: GBE010 No results found for: CAN125  No results found for: TOTALPROTELP, ALBUMINELP, A1GS, A2GS, BETS, BETA2SER, GAMS, MSPIKE, SPEI No results found for: TIBC, FERRITIN, IRONPCTSAT No results found for: LDH  STUDIES:  MR PELVIS W WO CONTRAST  Result Date: 11/07/2021 CLINICAL DATA:  Colonic diverticulosis. Suspected colovaginal fistula. EXAM: MRI PELVIS WITHOUT AND WITH CONTRAST TECHNIQUE: Multiplanar multisequence MR imaging of the pelvis was  performed both before and after administration of intravenous contrast. CONTRAST:  69m GADAVIST GADOBUTROL 1 MMOL/ML IV SOLN COMPARISON:  CT on 10/22/2021 FINDINGS: Lower Urinary Tract: Unremarkable urinary bladder. Normal appearance of urethra. Bowel: Diverticulosis of the sigmoid colon is seen, without evidence of diverticulitis. The sigmoid colon lies adjacent to the vaginal cuff, however there appears to be preservation of a thin fat plane between the 2 structures. No definite fistulous tract is identified on this exam. Vascular/Lymphatic: No pathologically enlarged lymph nodes or other significant abnormality in visualized portion of lower pelvis. Reproductive: Prior hysterectomy. Unremarkable appearance of the vaginal cuff. Other: None. Musculoskeletal: No suspicious bone lesions identified. IMPRESSION: Sigmoid diverticulosis, without evidence of active diverticulitis. No definite evidence of colovaginal fistula. If there is persistent clinical suspicion, consider pelvis CT with rectal contrast administration due to its higher sensitivity. Electronically Signed   By: JMarlaine HindM.D.   On: 11/07/2021 16:18      I, LRita Ohara am acting as scribe for CDerwood Kaplan MD  I have reviewed this report as typed by the medical scribe, and it is complete and accurate.

## 2021-12-05 ENCOUNTER — Inpatient Hospital Stay: Payer: Medicare HMO

## 2021-12-05 ENCOUNTER — Encounter: Payer: Self-pay | Admitting: Oncology

## 2021-12-05 ENCOUNTER — Other Ambulatory Visit: Payer: Self-pay

## 2021-12-05 ENCOUNTER — Inpatient Hospital Stay: Payer: Medicare HMO | Attending: Oncology | Admitting: Oncology

## 2021-12-05 ENCOUNTER — Other Ambulatory Visit: Payer: Self-pay | Admitting: Oncology

## 2021-12-05 DIAGNOSIS — Z9079 Acquired absence of other genital organ(s): Secondary | ICD-10-CM | POA: Diagnosis not present

## 2021-12-05 DIAGNOSIS — K7689 Other specified diseases of liver: Secondary | ICD-10-CM

## 2021-12-05 DIAGNOSIS — R110 Nausea: Secondary | ICD-10-CM | POA: Insufficient documentation

## 2021-12-05 DIAGNOSIS — E063 Autoimmune thyroiditis: Secondary | ICD-10-CM | POA: Diagnosis not present

## 2021-12-05 DIAGNOSIS — Z8042 Family history of malignant neoplasm of prostate: Secondary | ICD-10-CM | POA: Diagnosis not present

## 2021-12-05 DIAGNOSIS — K219 Gastro-esophageal reflux disease without esophagitis: Secondary | ICD-10-CM | POA: Diagnosis not present

## 2021-12-05 DIAGNOSIS — Z803 Family history of malignant neoplasm of breast: Secondary | ICD-10-CM | POA: Insufficient documentation

## 2021-12-05 DIAGNOSIS — R1909 Other intra-abdominal and pelvic swelling, mass and lump: Secondary | ICD-10-CM | POA: Insufficient documentation

## 2021-12-05 DIAGNOSIS — K5730 Diverticulosis of large intestine without perforation or abscess without bleeding: Secondary | ICD-10-CM | POA: Diagnosis not present

## 2021-12-05 DIAGNOSIS — M797 Fibromyalgia: Secondary | ICD-10-CM | POA: Insufficient documentation

## 2021-12-05 DIAGNOSIS — Z1509 Genetic susceptibility to other malignant neoplasm: Secondary | ICD-10-CM

## 2021-12-05 DIAGNOSIS — M25511 Pain in right shoulder: Secondary | ICD-10-CM | POA: Insufficient documentation

## 2021-12-05 DIAGNOSIS — R109 Unspecified abdominal pain: Secondary | ICD-10-CM | POA: Diagnosis not present

## 2021-12-05 DIAGNOSIS — G4730 Sleep apnea, unspecified: Secondary | ICD-10-CM | POA: Diagnosis not present

## 2021-12-05 DIAGNOSIS — Z90722 Acquired absence of ovaries, bilateral: Secondary | ICD-10-CM | POA: Insufficient documentation

## 2021-12-05 DIAGNOSIS — Z853 Personal history of malignant neoplasm of breast: Secondary | ICD-10-CM | POA: Diagnosis not present

## 2021-12-05 DIAGNOSIS — Z1501 Genetic susceptibility to malignant neoplasm of breast: Secondary | ICD-10-CM

## 2021-12-05 DIAGNOSIS — Z90710 Acquired absence of both cervix and uterus: Secondary | ICD-10-CM | POA: Diagnosis not present

## 2021-12-05 DIAGNOSIS — Z79899 Other long term (current) drug therapy: Secondary | ICD-10-CM | POA: Insufficient documentation

## 2021-12-05 DIAGNOSIS — K449 Diaphragmatic hernia without obstruction or gangrene: Secondary | ICD-10-CM | POA: Insufficient documentation

## 2021-12-05 DIAGNOSIS — R634 Abnormal weight loss: Secondary | ICD-10-CM | POA: Insufficient documentation

## 2021-12-05 DIAGNOSIS — Z1502 Genetic susceptibility to malignant neoplasm of ovary: Secondary | ICD-10-CM

## 2021-12-05 DIAGNOSIS — Z808 Family history of malignant neoplasm of other organs or systems: Secondary | ICD-10-CM | POA: Insufficient documentation

## 2021-12-05 DIAGNOSIS — Z800 Family history of malignant neoplasm of digestive organs: Secondary | ICD-10-CM | POA: Diagnosis not present

## 2021-12-05 DIAGNOSIS — Z8719 Personal history of other diseases of the digestive system: Secondary | ICD-10-CM | POA: Insufficient documentation

## 2021-12-05 DIAGNOSIS — Z9013 Acquired absence of bilateral breasts and nipples: Secondary | ICD-10-CM | POA: Diagnosis not present

## 2021-12-05 DIAGNOSIS — R630 Anorexia: Secondary | ICD-10-CM | POA: Diagnosis not present

## 2021-12-05 DIAGNOSIS — Z807 Family history of other malignant neoplasms of lymphoid, hematopoietic and related tissues: Secondary | ICD-10-CM | POA: Insufficient documentation

## 2021-12-08 LAB — CANCER ANTIGEN 27.29: CA 27.29: <3.5 U/mL (ref 0.0–38.6)

## 2021-12-08 LAB — CA 125: Cancer Antigen (CA) 125: 5.1 U/mL (ref 0.0–38.1)

## 2021-12-08 LAB — CEA: CEA: 2.7 ng/mL (ref 0.0–4.7)

## 2021-12-16 ENCOUNTER — Telehealth: Payer: Self-pay

## 2021-12-16 NOTE — Telephone Encounter (Signed)
-----   Message from [name], MD sent at 12/15/2021  6:39 PM EST ----- Regarding: call Tell her all cancer markers are normal

## 2021-12-17 NOTE — Telephone Encounter (Signed)
-----   Message from [name], MD sent at 12/15/2021  6:39 PM EST ----- Regarding: call Tell her all cancer markers are normal

## 2021-12-23 ENCOUNTER — Other Ambulatory Visit: Payer: Self-pay

## 2021-12-23 ENCOUNTER — Encounter: Payer: Self-pay | Admitting: Gastroenterology

## 2021-12-23 ENCOUNTER — Ambulatory Visit (AMBULATORY_SURGERY_CENTER): Payer: Medicare HMO | Admitting: Gastroenterology

## 2021-12-23 VITALS — BP 140/71 | HR 71 | Temp 98.0°F | Resp 14 | Ht 66.0 in | Wt 240.0 lb

## 2021-12-23 DIAGNOSIS — K635 Polyp of colon: Secondary | ICD-10-CM | POA: Diagnosis not present

## 2021-12-23 DIAGNOSIS — Z86010 Personal history of colonic polyps: Secondary | ICD-10-CM

## 2021-12-23 DIAGNOSIS — K319 Disease of stomach and duodenum, unspecified: Secondary | ICD-10-CM | POA: Diagnosis not present

## 2021-12-23 DIAGNOSIS — K2970 Gastritis, unspecified, without bleeding: Secondary | ICD-10-CM | POA: Diagnosis not present

## 2021-12-23 DIAGNOSIS — Z800 Family history of malignant neoplasm of digestive organs: Secondary | ICD-10-CM | POA: Diagnosis not present

## 2021-12-23 DIAGNOSIS — D123 Benign neoplasm of transverse colon: Secondary | ICD-10-CM

## 2021-12-23 DIAGNOSIS — K219 Gastro-esophageal reflux disease without esophagitis: Secondary | ICD-10-CM

## 2021-12-23 DIAGNOSIS — K5140 Inflammatory polyps of colon without complications: Secondary | ICD-10-CM | POA: Diagnosis not present

## 2021-12-23 DIAGNOSIS — K317 Polyp of stomach and duodenum: Secondary | ICD-10-CM | POA: Diagnosis not present

## 2021-12-23 DIAGNOSIS — R1310 Dysphagia, unspecified: Secondary | ICD-10-CM

## 2021-12-23 MED ORDER — SODIUM CHLORIDE 0.9 % IV SOLN: 500.0000 mL | Freq: Once | INTRAVENOUS | Status: DC

## 2021-12-23 NOTE — Patient Instructions (Addendum)
Please read handouts provided. Continue present medications. Continue taking omeprazole 40 mg daily. Await pathology results. Post Dilation Diet.   YOU HAD AN ENDOSCOPIC PROCEDURE TODAY AT [facility]:   Refer to the procedure report that was given to you for any specific questions about what was found during the examination.  If the procedure report does not answer your questions, please call your gastroenterologist to clarify.  If you requested that your care partner not be given the details of your procedure findings, then the procedure report has been included in a sealed envelope for you to review at your convenience later.  YOU SHOULD EXPECT: Some feelings of bloating in the abdomen. Passage of more gas than usual.  Walking can help get rid of the air that was put into your GI tract during the procedure and reduce the bloating. If you had a lower endoscopy (such as a colonoscopy or flexible sigmoidoscopy) you may notice spotting of blood in your stool or on the toilet paper. If you underwent a bowel prep for your procedure, you may not have a normal bowel movement for a few days.  Please Note:  You might notice some irritation and congestion in your nose or some drainage.  This is from the oxygen used during your procedure.  There is no need for concern and it should clear up in a day or so.  SYMPTOMS TO REPORT IMMEDIATELY:  Following lower endoscopy (colonoscopy or flexible sigmoidoscopy):  Excessive amounts of blood in the stool  Significant tenderness or worsening of abdominal pains  Swelling of the abdomen that is new, acute  Fever of 100F or higher  Following upper endoscopy (EGD)  Vomiting of blood or coffee ground material  New chest pain or pain under the shoulder blades  Painful or persistently difficult swallowing  New shortness of breath  Fever of 100F or higher  Black, tarry-looking stools  For urgent or emergent issues, a gastroenterologist can be  reached at any hour by calling [phone]. Do not use MyChart messaging for urgent concerns.    DIET:   Drink plenty of fluids but you should avoid alcoholic beverages for 24 hours.  ACTIVITY:  You should plan to take it easy for the rest of today and you should NOT DRIVE or use heavy machinery until tomorrow (because of the sedation medicines used during the test).    FOLLOW UP: Our staff will call the number listed on your records 48-72 hours following your procedure to check on you and address any questions or concerns that you may have regarding the information given to you following your procedure. If we do not reach you, we will leave a message.  We will attempt to reach you two times.  During this call, we will ask if you have developed any symptoms of COVID 19. If you develop any symptoms (ie: fever, flu-like symptoms, shortness of breath, cough etc.) before then, please call [phone].  If you test positive for Covid 19 in the 2 weeks post procedure, please call and report this information to [place].    If any biopsies were taken you will be contacted by phone or by letter within the next 1-3 weeks.  Please call us at [phone] if you have not heard about the biopsies in 3 weeks.    SIGNATURES/CONFIDENTIALITY: You and/or your care partner have signed paperwork which will be entered into your electronic medical record.  These signatures attest to the fact that that the information  above on your After Visit Summary has been reviewed and is understood.  Full responsibility of the confidentiality of this discharge information lies with you and/or your care-partner.

## 2021-12-23 NOTE — Progress Notes (Signed)
Pt's states no medical or surgical changes since previsit or office visit. 

## 2021-12-23 NOTE — Telephone Encounter (Signed)
-----   Message from [name], MD sent at 12/15/2021  6:39 PM EST ----- Regarding: call Tell her all cancer markers are normal

## 2021-12-23 NOTE — Op Note (Signed)
[facility] Patient Name: [place] Procedure Date: 12/23/2021 10:03 AM MRN: [id number] Endoscopist: Jackquline Denmark , MD Age: 61 Referring MD:  Date of Birth: [birth year] Gender: Female Account #: [id number] Procedure:                Upper GI endoscopy Indications:              Dysphagia Medicines:                Monitored Anesthesia Care Procedure:                Pre-Anesthesia Assessment:                           - Prior to the procedure, a History and Physical                            was performed, and patient medications and                            allergies were reviewed. The patient's tolerance of                            previous anesthesia was also reviewed. The risks                            and benefits of the procedure and the sedation                            options and risks were discussed with the patient.                            All questions were answered, and informed consent                            was obtained. Prior Anticoagulants: The patient has                            taken no previous anticoagulant or antiplatelet                            agents. ASA Grade Assessment: II - A patient with                            mild systemic disease. After reviewing the risks                            and benefits, the patient was deemed in                            satisfactory condition to undergo the procedure.                           After obtaining informed consent, the endoscope was  passed under direct vision. Throughout the                            procedure, the patient's blood pressure, pulse, and                            oxygen saturations were monitored continuously. The                            Endoscope was introduced through the mouth, and                            advanced to the second part of duodenum. The upper                            GI endoscopy was accomplished without difficulty.                             The patient tolerated the procedure well. Scope In: Scope Out: Findings:                 The examined esophagus was normal with well-defined                            Z-line at 36 cm. A minimal hiatal hernia was noted.                            Biopsies were not performed since previous biopsies                            were negative for eosinophilic esophagitis. The                            scope was withdrawn. Dilation was performed with a                            Maloney dilator with mild resistance at 97 Fr.                           Localized mild inflammation characterized by                            erythema was found in the gastric antrum. Biopsies                            were taken with a cold forceps for histology.                           A few 4 to 6 mm sessile polyps with no stigmata of                            recent bleeding were found in the gastric fundus  and in the gastric body. Not biopsied since                            previously determined to be fundic gland polyps.                           The examined duodenum was normal. Complications:            No immediate complications. Estimated Blood Loss:     Estimated blood loss: none. Impression:               - Minimal hiatal hernia.                           - Mild gastritis.                           - Gastric polyps.                           - S/P esophageal dilatation. Recommendation:           - Patient has a contact number available for                            emergencies. The signs and symptoms of potential                            delayed complications were discussed with the                            patient. Return to normal activities tomorrow.                            Written discharge instructions were provided to the                            patient.                           - Post dilatation diet.                            - Continue present medications including omeprazole                            40 mg p.o. daily.                           - Await pathology results.                           - The findings and recommendations were discussed                            with the patient's family. Jackquline Denmark, MD 12/23/2021 10:36:48 AM This report has been signed electronically.

## 2021-12-23 NOTE — Progress Notes (Signed)
Report to PACU, RN, vss, BBS= Clear.  

## 2021-12-23 NOTE — Progress Notes (Signed)
Called to room to assist during endoscopic procedure.  Patient ID and intended procedure confirmed with present staff. Received instructions for my participation in the procedure from the performing physician.  

## 2021-12-23 NOTE — Progress Notes (Signed)
Chief Complaint: FU  Referring Provider:  [name], MD      ASSESSMENT AND PLAN;   #1. GERD with H/O HH with occ dysphagia. Neg prev Bx for EoE/Barrett's.  #2. IBS with alternating diarrhea and constipation.  #3. FH colon Ca (mom at age 61). H/O colonic polyps.   #4. RLQ/LLQ pain with wt loss   Plan:  -EGD with dil/colon. -Continue omeprazole 40 mg p.o. once a day -Non-pharmacologic means of reflux control.   I discussed EGD/Colonoscopy- the indications, risks, alternatives and potential complications including, but not limited to, bleeding, infection, reaction to medication, damage to internal organs, cardiac and/or pulmonary problems, and perforation requiring surgery. The possibility that significant findings could be missed was explained. All ? were answered. The patient gives consent to proceed. HPI:    Laura Hill is a 61 y.o. female  With history of breast cancer (BRCA 2+, s/p B/L mastectomy, hysterectomy), fibromyalgia, GERD, IC, IBS, obesity, HLD, hypothyroidism, chronic headaches, OSA,   C/O IBS Dx 2009, alt diarrhea and constipation with bloating.  When she gets constipated she will take Linzess 290 mcg.she rarely uses Imodium.  Has associated lower abdominal pain which gets better with defecation.  Recently had wt loss 20lb over 1 year.  Occ rectal bleeding d/t hoids.  Better with Preparation H.  Longstanding history of gastroesophageal reflux, better with omeprazole.  She does have intermittent solid food dysphagia.  Had previous several esophageal dilatations as below.  Would like to get dilation performed  Family history of colon cancer -Mom at age 30, [place] at 46, MM  Braca2 +.  [place] was negative.  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  MRI did not show any evidence of fistula.  She is not having frequent UTIs.  No history of pneumaturia.  She also had fatty liver on CT Abdo/pelvis.  Normal LFTs.  No  alcohol.  Wt Readings from Last 3 Encounters:  12/23/21 240 lb (108.9 kg)  12/05/21 240 lb 3.2 oz (109 kg)  11/25/21 239 lb (108.4 kg)     Past GI work-up  CT AP with contrast 10/22/2021 1. Extensive sigmoid colonic diverticulosis with a portion of sigmoid colon intimately associated with the left vaginal cuff, with gas and trace hyperdense material in the vagina. 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 Consider more definitive characterization with CT abdomen and pelvis with IV and per rectal contrast material. No evidence of acute diverticulitis. 2. Diffuse hepatic steatosis. 3.  Aortic Atherosclerosis (ICD10-I70.0).  MRI: Pelvis 11/07/2021 IMPRESSION: Sigmoid diverticulosis, without evidence of active diverticulitis.   No definite evidence of colovaginal fistula. If there is persistent clinical suspicion, consider pelvis CT with rectal contrast administration due to its higher sensitivity.     Past GI work-up (Dr Kendrick Fries at [facility]): Colonoscopy 11/27/2018 ([facility]): Mild diverticulosis, grade 1 internal hemorrhoids, strong family history of colon cancer.  Repeat in 3 years. Colonoscopy 07/13/2017 grade 2 internal hemorrhoids s/p banding, mild diverticulosis, neg colon biopsies. Colonoscopy 09/03/2015 mild diverticulosis, internal hemorrhoids. Colonoscopy 02/2013 8 mm cecal polyp tubulovillous adenoma, ascending colon 6 mm polyp tubular adenoma. EGD 07/13/2017: Mild gastritis, fundic gland polyps.  Dilation empiric 54Fr (better) EGD 08/24/2015: Empiric 54 Fr, no Barrett's or EOE.  No obvious hiatal hernia. EGD 02/22/2013 LA grade a EE, 54Fr Past Medical History:  Diagnosis Date   Breast cancer ([place])    DDD (degenerative disc disease), cervical    Fibromyalgia    Hashimoto's disease  Hypercholesteremia    Irritable bowel syndrome (IBS)    Migraine, cervicogenic    Osteoporosis    Thyroid disease     Past Surgical History:  Procedure Laterality  Date   ABDOMINAL HYSTERECTOMY     BREAST RECONSTRUCTION Bilateral 2010   DILATATION AND CURETTAGE/HYSTEROSCOPY WITH MINERVA     LEG SURGERY     MASTECTOMY Bilateral 2009   SALPINGOOPHORECTOMY Bilateral     Family History  Problem Relation Age of Onset   Colon cancer Mother 58   Multiple myeloma Mother 37   Prostate cancer Father 64   Breast cancer Sister 47   Cancer Brother 14       oral   Breast cancer Brother 51   Skin cancer Maternal Aunt        70s   Colon cancer Maternal Uncle 76   Stomach cancer Maternal Uncle 75   Breast cancer Paternal Aunt 56   Breast cancer Paternal Grandmother 56   Melanoma Cousin 39       maternal    Social History   Tobacco Use   Smoking status: Never   Smokeless tobacco: Never  Vaping Use   Vaping Use: Never used  Substance Use Topics   Alcohol use: Never   Drug use: Never    Current Outpatient Medications  Medication Sig Dispense Refill   DULoxetine (CYMBALTA) 60 MG capsule Take 60 mg by mouth daily.     levothyroxine (SYNTHROID) 112 MCG tablet Take 1 tablet (112 mcg total) by mouth daily. 90 tablet 3   omeprazole (PRILOSEC) 40 MG capsule Take 40 mg by mouth daily.     pregabalin (LYRICA) 100 MG capsule Take 100 mg by mouth daily.     rosuvastatin (CRESTOR) 20 MG tablet Take 20 mg by mouth daily.     tamsulosin (FLOMAX) 0.4 MG CAPS capsule Take 0.4 mg by mouth at bedtime.     tiZANidine (ZANAFLEX) 4 MG tablet Take 4 mg by mouth daily.     amoxicillin (AMOXIL) 500 MG capsule as directed.     dicyclomine (BENTYL) 20 MG tablet Take 20 mg by mouth as directed.     HYDROcodone-acetaminophen (NORCO/VICODIN) 5-325 MG tablet Take 1 tablet by mouth daily as needed.     linaCLOtide (LINZESS PO) Take by mouth as needed.     Current Facility-Administered Medications  Medication Dose Route Frequency Provider Last Rate Last Admin   0.9 %  sodium chloride infusion  500 mL Intravenous Once Jackquline Denmark, MD        Allergies  Allergen  Reactions   Bactrim [Sulfamethoxazole-Trimethoprim] Hives    Facial swelling with hives    ROS: Psychiatric/Behavioral: has anxiety or depression     Physical Exam:    BP 114/66    Pulse 78    Temp 98 F (36.7 C)    Ht '5\' 6"'  (1.676 m)    Wt 240 lb (108.9 kg)    SpO2 96%    BMI 38.74 kg/m  Wt Readings from Last 3 Encounters:  12/23/21 240 lb (108.9 kg)  12/05/21 240 lb 3.2 oz (109 kg)  11/25/21 239 lb (108.4 kg)   Constitutional:  Well-developed, in no acute distress. Psychiatric: Normal mood and affect. Behavior is normal. HEENT: Pupils normal.  Conjunctivae are normal. No scleral icterus. Cardiovascular: Normal rate, regular rhythm. No edema Pulmonary/chest: Effort normal and breath sounds normal. No wheezing, rales or rhonchi. Abdominal: Soft, nondistended. Nontender. Bowel sounds active throughout. There are no masses palpable. No  hepatomegaly. Rectal: Deferred Neurological: Alert and oriented to person place and time. Skin: Skin is warm and dry. No rashes noted.  Labs reviewed  From 02/2021 -B12 253 -TSH 1.5 -Normal CMP with AST 17, ALT 15, albumin 4.2, normal BUN/creatinine -Normal CBC with hemoglobin 14.5, MCV 83.  CT/MRI reviewed independently  Carmell Austria, MD 12/23/2021, 10:00 AM  Cc: [name], MD

## 2021-12-23 NOTE — Op Note (Signed)
[facility] Patient Name: Laura Hill Procedure Date: 12/23/2021 10:03 AM MRN: [id number] Endoscopist: Jackquline Denmark , MD Age: 61 Referring MD:  Date of Birth: Dec 03, 1960 Gender: Female Account #: [id number] Procedure:                Colonoscopy Indications:              High risk colon cancer surveillance: Personal                            history of colonic polyps. FH of colon cancer (mom                            at age 37) Medicines:                Monitored Anesthesia Care Procedure:                Pre-Anesthesia Assessment:                           - Prior to the procedure, a History and Physical                            was performed, and patient medications and                            allergies were reviewed. The patient's tolerance of                            previous anesthesia was also reviewed. The risks                            and benefits of the procedure and the sedation                            options and risks were discussed with the patient.                            All questions were answered, and informed consent                            was obtained. Prior Anticoagulants: The patient has                            taken no previous anticoagulant or antiplatelet                            agents. ASA Grade Assessment: II - A patient with                            mild systemic disease. After reviewing the risks                            and benefits, the patient was deemed in  satisfactory condition to undergo the procedure.                           After obtaining informed consent, the colonoscope                            was passed under direct vision. Throughout the                            procedure, the patient's blood pressure, pulse, and                            oxygen saturations were monitored continuously. The                            Olympus PCF-H190DL (#[id number]) Colonoscope was                             introduced through the anus and advanced to the the                            cecum, identified by appendiceal orifice and                            ileocecal valve. The colonoscopy was performed                            without difficulty. The patient tolerated the                            procedure well. The quality of the bowel                            preparation was adequate to identify polyps.                            Limited preparation especially in the right side of                            the colon with retained solid vegetable material                            which will clog the suction channel of the scope.                            The ileocecal valve, appendiceal orifice, and                            rectum were photographed. Scope In: 10:14:28 AM Scope Out: 10:28:59 AM Scope Withdrawal Time: 0 hours 12 minutes 54 seconds  Total Procedure Duration: 0 hours 14 minutes 31 seconds  Findings:                 A 6 mm polyp was found in the proximal transverse  colon. The polyp was sessile. The polyp was removed                            with a cold snare. Resection and retrieval were                            complete.                           Multiple medium-mouthed diverticula were found in                            the sigmoid colon.                           Non-bleeding internal hemorrhoids were found during                            retroflexion. The hemorrhoids were small and Grade                            I (internal hemorrhoids that do not prolapse).                           The exam was otherwise without abnormality on                            direct and retroflexion views. Complications:            No immediate complications. Estimated Blood Loss:     Estimated blood loss: none. Impression:               - One 6 mm polyp in the proximal transverse colon,                            removed with a  cold snare. Resected and retrieved.                           - Diverticulosis in the sigmoid colon.                           - Non-bleeding internal hemorrhoids.                           - The examination was otherwise normal on direct                            and retroflexion views. Recommendation:           - Patient has a contact number available for                            emergencies. The signs and symptoms of potential                            delayed complications were discussed with the  patient. Return to normal activities tomorrow.                            Written discharge instructions were provided to the                            patient.                           - Resume previous diet.                           - Continue present medications.                           - Await pathology results.                           - Recommend to continue MiraLAX 17 g p.o. once a day                           - Repeat colonoscopy for surveillance based on                            pathology results.                           - Use HC Cream 2.5%: Apply externally BID for 2                            weeks. 2RF                           - The findings and recommendations were discussed                            with the patient's family. Jackquline Denmark, MD 12/23/2021 10:41:04 AM This report has been signed electronically.

## 2021-12-25 ENCOUNTER — Telehealth: Payer: Self-pay | Admitting: *Deleted

## 2021-12-25 ENCOUNTER — Telehealth: Payer: Self-pay

## 2021-12-25 DIAGNOSIS — M549 Dorsalgia, unspecified: Secondary | ICD-10-CM | POA: Diagnosis not present

## 2021-12-25 DIAGNOSIS — M797 Fibromyalgia: Secondary | ICD-10-CM | POA: Diagnosis not present

## 2021-12-25 DIAGNOSIS — G894 Chronic pain syndrome: Secondary | ICD-10-CM | POA: Diagnosis not present

## 2021-12-25 DIAGNOSIS — M5136 Other intervertebral disc degeneration, lumbar region: Secondary | ICD-10-CM | POA: Diagnosis not present

## 2021-12-25 DIAGNOSIS — M5030 Other cervical disc degeneration, unspecified cervical region: Secondary | ICD-10-CM | POA: Diagnosis not present

## 2021-12-25 DIAGNOSIS — Z1389 Encounter for screening for other disorder: Secondary | ICD-10-CM | POA: Diagnosis not present

## 2021-12-25 NOTE — Telephone Encounter (Signed)
Attempted to call patient. Number said it can not be dialed at this time.

## 2021-12-25 NOTE — Telephone Encounter (Signed)
°  Follow up Call-  Call back number 12/23/2021  Post procedure Call Back phone  # [phone]  Permission to leave phone message Yes     First attempt for follow up phone call. No answer at number given.  Left message on voicemail.

## 2021-12-25 NOTE — Telephone Encounter (Signed)
Second attempt follow up call to pt, no answer.  

## 2021-12-25 NOTE — Telephone Encounter (Signed)
-----   Message from [name], MD sent at 12/15/2021  6:39 PM EST ----- Regarding: call Tell her all cancer markers are normal

## 2021-12-26 ENCOUNTER — Other Ambulatory Visit: Payer: Self-pay

## 2021-12-26 DIAGNOSIS — K649 Unspecified hemorrhoids: Secondary | ICD-10-CM

## 2021-12-26 MED ORDER — HYDROCORTISONE (PERIANAL) 2.5 % EX CREA: 1.0000 "application " | TOPICAL_CREAM | Freq: Two times a day (BID) | CUTANEOUS | 2 refills | Status: DC

## 2021-12-27 ENCOUNTER — Encounter: Payer: Self-pay | Admitting: Gastroenterology

## 2022-01-01 DIAGNOSIS — L570 Actinic keratosis: Secondary | ICD-10-CM | POA: Diagnosis not present

## 2022-01-01 DIAGNOSIS — L918 Other hypertrophic disorders of the skin: Secondary | ICD-10-CM | POA: Diagnosis not present

## 2022-01-01 DIAGNOSIS — L821 Other seborrheic keratosis: Secondary | ICD-10-CM | POA: Diagnosis not present

## 2022-01-01 DIAGNOSIS — D2362 Other benign neoplasm of skin of left upper limb, including shoulder: Secondary | ICD-10-CM | POA: Diagnosis not present

## 2022-01-01 DIAGNOSIS — L910 Hypertrophic scar: Secondary | ICD-10-CM | POA: Diagnosis not present

## 2022-01-01 DIAGNOSIS — D485 Neoplasm of uncertain behavior of skin: Secondary | ICD-10-CM | POA: Diagnosis not present

## 2022-01-01 DIAGNOSIS — L819 Disorder of pigmentation, unspecified: Secondary | ICD-10-CM | POA: Diagnosis not present

## 2022-01-01 DIAGNOSIS — D181 Lymphangioma, any site: Secondary | ICD-10-CM | POA: Diagnosis not present

## 2022-01-01 DIAGNOSIS — D2261 Melanocytic nevi of right upper limb, including shoulder: Secondary | ICD-10-CM | POA: Diagnosis not present

## 2022-01-09 DIAGNOSIS — Z6837 Body mass index (BMI) 37.0-37.9, adult: Secondary | ICD-10-CM | POA: Diagnosis not present

## 2022-01-09 DIAGNOSIS — J342 Deviated nasal septum: Secondary | ICD-10-CM | POA: Diagnosis not present

## 2022-01-09 DIAGNOSIS — R051 Acute cough: Secondary | ICD-10-CM | POA: Diagnosis not present

## 2022-01-09 DIAGNOSIS — H66001 Acute suppurative otitis media without spontaneous rupture of ear drum, right ear: Secondary | ICD-10-CM | POA: Diagnosis not present

## 2022-01-09 DIAGNOSIS — K580 Irritable bowel syndrome with diarrhea: Secondary | ICD-10-CM | POA: Diagnosis not present

## 2022-02-19 DIAGNOSIS — M797 Fibromyalgia: Secondary | ICD-10-CM | POA: Diagnosis not present

## 2022-02-19 DIAGNOSIS — M5416 Radiculopathy, lumbar region: Secondary | ICD-10-CM | POA: Diagnosis not present

## 2022-02-19 DIAGNOSIS — M5136 Other intervertebral disc degeneration, lumbar region: Secondary | ICD-10-CM | POA: Diagnosis not present

## 2022-02-19 DIAGNOSIS — M5030 Other cervical disc degeneration, unspecified cervical region: Secondary | ICD-10-CM | POA: Diagnosis not present

## 2022-02-19 DIAGNOSIS — Z1389 Encounter for screening for other disorder: Secondary | ICD-10-CM | POA: Diagnosis not present

## 2022-02-19 DIAGNOSIS — G894 Chronic pain syndrome: Secondary | ICD-10-CM | POA: Diagnosis not present

## 2022-02-19 DIAGNOSIS — M549 Dorsalgia, unspecified: Secondary | ICD-10-CM | POA: Diagnosis not present

## 2022-03-10 ENCOUNTER — Ambulatory Visit: Payer: Medicare HMO | Admitting: Endocrinology

## 2022-03-10 ENCOUNTER — Encounter: Payer: Self-pay | Admitting: Endocrinology

## 2022-03-10 ENCOUNTER — Telehealth: Payer: Self-pay | Admitting: Endocrinology

## 2022-03-10 VITALS — BP 118/82 | HR 76 | Ht 66.0 in | Wt 236.0 lb

## 2022-03-10 DIAGNOSIS — E039 Hypothyroidism, unspecified: Secondary | ICD-10-CM | POA: Diagnosis not present

## 2022-03-10 LAB — T4, FREE: Free T4: 1.19 ng/dL (ref 0.60–1.60)

## 2022-03-10 LAB — TSH: TSH: 0.01 u[IU]/mL — ABNORMAL LOW (ref 0.35–5.50)

## 2022-03-10 MED ORDER — LEVOTHYROXINE SODIUM 88 MCG PO TABS: 88.0000 ug | ORAL_TABLET | Freq: Every day | ORAL | 1 refills | Status: DC

## 2022-03-10 NOTE — Telephone Encounter (Signed)
Please obtain copy of thyroid US report from [place] (2022 or 2023).   ?

## 2022-03-10 NOTE — Patient Instructions (Addendum)
Blood tests are requested for you today.  We'll let you know about the results.  ?We are obtaining the ultrasound report from [facility].  We'll let you know.  If you do not hear back in 1 week, please call or message [place].   ?You should have an endocrinology follow-up appointment in 1 year.   ? ? ?

## 2022-03-10 NOTE — Progress Notes (Signed)
? ?Subjective:  ? ? Patient ID: Laura Hill, female    DOB: February 21, 1961, 61 y.o.   MRN: [id number] ? ?HPI ?Pt returns for f/u of chronic primary hypothyroidism (dx'ed 2012; she has never been on amiodarone or lithium; she had bx of 2 nodules in approx 2017, in [place], but we don't have results; records received from [facility] were from plastic surg, not thyroid).  pt states she feels well in general.   ?Past Medical History:  ?Diagnosis Date  ? Breast cancer ([place])   ? DDD (degenerative disc disease), cervical   ? Fibromyalgia   ? Hashimoto's disease   ? Hypercholesteremia   ? Irritable bowel syndrome (IBS)   ? Migraine, cervicogenic   ? Osteoporosis   ? Thyroid disease   ? ? ?Past Surgical History:  ?Procedure Laterality Date  ? ABDOMINAL HYSTERECTOMY    ? BREAST RECONSTRUCTION Bilateral 2010  ? DILATATION AND CURETTAGE/HYSTEROSCOPY WITH MINERVA    ? LEG SURGERY    ? MASTECTOMY Bilateral 2009  ? SALPINGOOPHORECTOMY Bilateral   ? ? ?Social History  ? ?Socioeconomic History  ? Marital status: Married  ?  Spouse name: Marcello Moores  ? Number of children: 4  ? Years of education: 12+  ? Highest education level: 12th grade  ?Occupational History  ? Not on file  ?Tobacco Use  ? Smoking status: Never  ? Smokeless tobacco: Never  ?Vaping Use  ? Vaping Use: Never used  ?Substance and Sexual Activity  ? Alcohol use: Never  ? Drug use: Never  ? Sexual activity: Not Currently  ?Other Topics Concern  ? Not on file  ?Social History Narrative  ? Not on file  ? ?Social Determinants of Health  ? ?Financial Resource Strain: Not on file  ?Food Insecurity: Not on file  ?Transportation Needs: Not on file  ?Physical Activity: Not on file  ?Stress: Not on file  ?Social Connections: Not on file  ?Intimate Partner Violence: Not on file  ? ? ?Current Outpatient Medications on File Prior to Visit  ?Medication Sig Dispense Refill  ? amoxicillin (AMOXIL) 500 MG capsule as directed.    ? dicyclomine (BENTYL) 20 MG tablet Take 20 mg by mouth as directed.    ?  DULoxetine (CYMBALTA) 60 MG capsule Take 60 mg by mouth daily.    ? HYDROcodone-acetaminophen (NORCO/VICODIN) 5-325 MG tablet Take 1 tablet by mouth daily as needed.    ? hydrocortisone (ANUSOL-HC) 2.5 % rectal cream Place 1 application rectally 2 (two) times daily. Apply externally twice a day for 2 weeks 30 g 2  ? linaCLOtide (LINZESS PO) Take by mouth as needed.    ? omeprazole (PRILOSEC) 40 MG capsule Take 40 mg by mouth daily.    ? pregabalin (LYRICA) 100 MG capsule Take 100 mg by mouth daily.    ? rosuvastatin (CRESTOR) 20 MG tablet Take 20 mg by mouth daily.    ? tamsulosin (FLOMAX) 0.4 MG CAPS capsule Take 0.4 mg by mouth at bedtime.    ? tiZANidine (ZANAFLEX) 4 MG tablet Take 4 mg by mouth daily.    ? ?No current facility-administered medications on file prior to visit.  ? ? ?Allergies  ?Allergen Reactions  ? Bactrim [Sulfamethoxazole-Trimethoprim] Hives  ?  Facial swelling with hives ?  ? ? ?Family History  ?Problem Relation Age of Onset  ? Colon cancer Mother 21  ? Multiple myeloma Mother 33  ? Prostate cancer Father 29  ? Breast cancer Sister 58  ? Cancer Brother 41  ?  oral  ? Breast cancer Brother 46  ? Skin cancer Maternal Aunt   ?     8s  ? Colon cancer Maternal Uncle 70  ? Stomach cancer Maternal Uncle 75  ? Breast cancer Paternal Aunt 37  ? Breast cancer Paternal Grandmother 51  ? Melanoma Cousin 51  ?     maternal  ? ? ?BP 118/82 (BP Location: Left Arm, Patient Position: Sitting, Cuff Size: Normal)   Pulse 76   Ht 5' 6" (1.676 m)   Wt 236 lb (107 kg)   SpO2 96%   BMI 38.09 kg/m?  ? ? ?Review of Systems ? ?   ?Objective:  ? Physical Exam ?VITAL SIGNS:  See vs page ?GENERAL: no distress ?NECK: There is no palpable thyroid enlargement.  No thyroid nodule is palpable.  No palpable lymphadenopathy at the anterior neck.    ? ? ?Lab Results  ?Component Value Date  ? TSH 0.01 (L) 03/10/2022  ? ?   ?Assessment & Plan:  ?Hypothyroidism: overcontrolled.  I have sent a prescription to your pharmacy,  to reduce synthroid.   ? ?Patient Instructions  ?Blood tests are requested for you today.  We'll let you know about the results.  ?We are obtaining the ultrasound report from [facility].  We'll let you know.  If you do not hear back in 1 week, please call or message [place].   ?You should have an endocrinology follow-up appointment in 1 year.   ? ? ? ? ?

## 2022-03-12 NOTE — Telephone Encounter (Signed)
ROI for thyroid US has now been filled out and placed up front for outgoing mail ?

## 2022-03-12 NOTE — Telephone Encounter (Signed)
For the providers interpretation or the scan she wont need to sign the release but anything detailed she will need to sign the release of records ?

## 2022-03-31 DIAGNOSIS — C50919 Malignant neoplasm of unspecified site of unspecified female breast: Secondary | ICD-10-CM | POA: Diagnosis not present

## 2022-03-31 DIAGNOSIS — E785 Hyperlipidemia, unspecified: Secondary | ICD-10-CM | POA: Diagnosis not present

## 2022-03-31 DIAGNOSIS — E538 Deficiency of other specified B group vitamins: Secondary | ICD-10-CM | POA: Diagnosis not present

## 2022-03-31 DIAGNOSIS — E038 Other specified hypothyroidism: Secondary | ICD-10-CM | POA: Diagnosis not present

## 2022-03-31 DIAGNOSIS — K219 Gastro-esophageal reflux disease without esophagitis: Secondary | ICD-10-CM | POA: Diagnosis not present

## 2022-03-31 DIAGNOSIS — Z0000 Encounter for general adult medical examination without abnormal findings: Secondary | ICD-10-CM | POA: Diagnosis not present

## 2022-03-31 DIAGNOSIS — E063 Autoimmune thyroiditis: Secondary | ICD-10-CM | POA: Diagnosis not present

## 2022-03-31 DIAGNOSIS — Z131 Encounter for screening for diabetes mellitus: Secondary | ICD-10-CM | POA: Diagnosis not present

## 2022-03-31 DIAGNOSIS — E559 Vitamin D deficiency, unspecified: Secondary | ICD-10-CM | POA: Diagnosis not present

## 2022-04-02 DIAGNOSIS — E538 Deficiency of other specified B group vitamins: Secondary | ICD-10-CM | POA: Diagnosis not present

## 2022-04-09 DIAGNOSIS — D510 Vitamin B12 deficiency anemia due to intrinsic factor deficiency: Secondary | ICD-10-CM | POA: Diagnosis not present

## 2022-04-21 DIAGNOSIS — D510 Vitamin B12 deficiency anemia due to intrinsic factor deficiency: Secondary | ICD-10-CM | POA: Diagnosis not present

## 2022-04-23 DIAGNOSIS — G894 Chronic pain syndrome: Secondary | ICD-10-CM | POA: Diagnosis not present

## 2022-04-23 DIAGNOSIS — M5416 Radiculopathy, lumbar region: Secondary | ICD-10-CM | POA: Diagnosis not present

## 2022-04-23 DIAGNOSIS — Z1389 Encounter for screening for other disorder: Secondary | ICD-10-CM | POA: Diagnosis not present

## 2022-04-23 DIAGNOSIS — M797 Fibromyalgia: Secondary | ICD-10-CM | POA: Diagnosis not present

## 2022-04-23 DIAGNOSIS — M5030 Other cervical disc degeneration, unspecified cervical region: Secondary | ICD-10-CM | POA: Diagnosis not present

## 2022-04-23 DIAGNOSIS — M5136 Other intervertebral disc degeneration, lumbar region: Secondary | ICD-10-CM | POA: Diagnosis not present

## 2022-04-23 DIAGNOSIS — M549 Dorsalgia, unspecified: Secondary | ICD-10-CM | POA: Diagnosis not present

## 2022-04-30 DIAGNOSIS — D519 Vitamin B12 deficiency anemia, unspecified: Secondary | ICD-10-CM | POA: Diagnosis not present

## 2022-05-27 DIAGNOSIS — E559 Vitamin D deficiency, unspecified: Secondary | ICD-10-CM | POA: Diagnosis not present

## 2022-05-27 DIAGNOSIS — Z6834 Body mass index (BMI) 34.0-34.9, adult: Secondary | ICD-10-CM | POA: Diagnosis not present

## 2022-05-27 DIAGNOSIS — E038 Other specified hypothyroidism: Secondary | ICD-10-CM | POA: Diagnosis not present

## 2022-05-27 DIAGNOSIS — E669 Obesity, unspecified: Secondary | ICD-10-CM | POA: Diagnosis not present

## 2022-05-27 DIAGNOSIS — E063 Autoimmune thyroiditis: Secondary | ICD-10-CM | POA: Diagnosis not present

## 2022-05-27 DIAGNOSIS — M797 Fibromyalgia: Secondary | ICD-10-CM | POA: Diagnosis not present

## 2022-05-27 DIAGNOSIS — E538 Deficiency of other specified B group vitamins: Secondary | ICD-10-CM | POA: Diagnosis not present

## 2022-06-05 DIAGNOSIS — D519 Vitamin B12 deficiency anemia, unspecified: Secondary | ICD-10-CM | POA: Diagnosis not present

## 2022-06-18 DIAGNOSIS — M5030 Other cervical disc degeneration, unspecified cervical region: Secondary | ICD-10-CM | POA: Diagnosis not present

## 2022-06-18 DIAGNOSIS — Z1389 Encounter for screening for other disorder: Secondary | ICD-10-CM | POA: Diagnosis not present

## 2022-06-18 DIAGNOSIS — M5136 Other intervertebral disc degeneration, lumbar region: Secondary | ICD-10-CM | POA: Diagnosis not present

## 2022-06-18 DIAGNOSIS — M797 Fibromyalgia: Secondary | ICD-10-CM | POA: Diagnosis not present

## 2022-06-18 DIAGNOSIS — M549 Dorsalgia, unspecified: Secondary | ICD-10-CM | POA: Diagnosis not present

## 2022-06-18 DIAGNOSIS — M5416 Radiculopathy, lumbar region: Secondary | ICD-10-CM | POA: Diagnosis not present

## 2022-06-18 DIAGNOSIS — G894 Chronic pain syndrome: Secondary | ICD-10-CM | POA: Diagnosis not present

## 2022-07-07 ENCOUNTER — Ambulatory Visit: Payer: Medicare HMO | Admitting: Endocrinology

## 2022-07-08 DIAGNOSIS — Z6833 Body mass index (BMI) 33.0-33.9, adult: Secondary | ICD-10-CM | POA: Diagnosis not present

## 2022-07-08 DIAGNOSIS — E038 Other specified hypothyroidism: Secondary | ICD-10-CM | POA: Diagnosis not present

## 2022-07-08 DIAGNOSIS — N3001 Acute cystitis with hematuria: Secondary | ICD-10-CM | POA: Diagnosis not present

## 2022-07-08 DIAGNOSIS — R300 Dysuria: Secondary | ICD-10-CM | POA: Diagnosis not present

## 2022-07-08 DIAGNOSIS — D519 Vitamin B12 deficiency anemia, unspecified: Secondary | ICD-10-CM | POA: Diagnosis not present

## 2022-07-08 DIAGNOSIS — E063 Autoimmune thyroiditis: Secondary | ICD-10-CM | POA: Diagnosis not present

## 2022-07-08 DIAGNOSIS — E538 Deficiency of other specified B group vitamins: Secondary | ICD-10-CM | POA: Diagnosis not present

## 2022-08-03 DIAGNOSIS — D649 Anemia, unspecified: Secondary | ICD-10-CM | POA: Diagnosis not present

## 2022-08-03 DIAGNOSIS — E039 Hypothyroidism, unspecified: Secondary | ICD-10-CM | POA: Diagnosis not present

## 2022-08-03 DIAGNOSIS — J343 Hypertrophy of nasal turbinates: Secondary | ICD-10-CM | POA: Diagnosis not present

## 2022-08-03 DIAGNOSIS — E785 Hyperlipidemia, unspecified: Secondary | ICD-10-CM | POA: Diagnosis not present

## 2022-08-03 DIAGNOSIS — R0981 Nasal congestion: Secondary | ICD-10-CM | POA: Diagnosis not present

## 2022-08-03 DIAGNOSIS — R0683 Snoring: Secondary | ICD-10-CM | POA: Diagnosis not present

## 2022-08-03 DIAGNOSIS — J342 Deviated nasal septum: Secondary | ICD-10-CM | POA: Diagnosis not present

## 2022-08-03 DIAGNOSIS — J329 Chronic sinusitis, unspecified: Secondary | ICD-10-CM | POA: Diagnosis not present

## 2022-08-11 DIAGNOSIS — M5030 Other cervical disc degeneration, unspecified cervical region: Secondary | ICD-10-CM | POA: Diagnosis not present

## 2022-08-11 DIAGNOSIS — M549 Dorsalgia, unspecified: Secondary | ICD-10-CM | POA: Diagnosis not present

## 2022-08-11 DIAGNOSIS — M5416 Radiculopathy, lumbar region: Secondary | ICD-10-CM | POA: Diagnosis not present

## 2022-08-11 DIAGNOSIS — G894 Chronic pain syndrome: Secondary | ICD-10-CM | POA: Diagnosis not present

## 2022-08-11 DIAGNOSIS — M5136 Other intervertebral disc degeneration, lumbar region: Secondary | ICD-10-CM | POA: Diagnosis not present

## 2022-08-11 DIAGNOSIS — M797 Fibromyalgia: Secondary | ICD-10-CM | POA: Diagnosis not present

## 2022-08-11 DIAGNOSIS — Z1389 Encounter for screening for other disorder: Secondary | ICD-10-CM | POA: Diagnosis not present

## 2022-08-31 DIAGNOSIS — N3001 Acute cystitis with hematuria: Secondary | ICD-10-CM | POA: Diagnosis not present

## 2022-08-31 DIAGNOSIS — R109 Unspecified abdominal pain: Secondary | ICD-10-CM | POA: Diagnosis not present

## 2022-09-03 ENCOUNTER — Encounter: Payer: Self-pay | Admitting: Hematology and Oncology

## 2022-09-03 ENCOUNTER — Telehealth: Payer: Self-pay

## 2022-09-03 ENCOUNTER — Inpatient Hospital Stay: Payer: Medicare HMO | Attending: Hematology and Oncology | Admitting: Hematology and Oncology

## 2022-09-03 VITALS — BP 139/71 | HR 80 | Temp 98.3°F | Resp 20 | Ht 66.0 in | Wt 207.4 lb

## 2022-09-03 DIAGNOSIS — C50011 Malignant neoplasm of nipple and areola, right female breast: Secondary | ICD-10-CM

## 2022-09-03 DIAGNOSIS — M7989 Other specified soft tissue disorders: Secondary | ICD-10-CM

## 2022-09-03 DIAGNOSIS — Z853 Personal history of malignant neoplasm of breast: Secondary | ICD-10-CM | POA: Diagnosis not present

## 2022-09-03 HISTORY — DX: Other specified soft tissue disorders: M79.89

## 2022-09-03 NOTE — Progress Notes (Signed)
Laura Hill  [address] [place],  [place]  [address] [phone]  Clinic Day:  09/03/2022  Referring physician: [name], MD  ASSESSMENT & PLAN:   Assessment & Plan: Mass of soft tissue of chest New soft tissue mass posterolateral chest wall near end of tram flap incision. This is most consistent with a lipoma. We will evaluate with ultrasound.  We will plan to call her with the results.  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 She received dose dense AC followed by weekly Taxol, as well as endocrine therapy for a total of 10 years. She has undergone bilateral mastectomies with reconstruction.  There was a tiny hypodense lesion of the left lobe of the liver, which is too small to accurately characterize, but statistically likely to reflect cyst.  We planned to follow this.  New soft tissue mass of the left posterolateral chest wall near the TRAM flap incision.  This is most consistent with a lipoma, but we will obtain ultrasound of the area for further evaluation.  As long as this is benign.  I will plan to see her back in January with repeat CT abdomen to reevaluate the liver lesion.   The patient understands the plans discussed today and is in agreement with them.  She knows to contact our office if she develops concerns prior to her next appointment.   The patient was seen, examined and plan formulated with Dr. Hinton Rao.  We provided 30 minutes of face-to-face time during this encounter and > 50% was spent counseling as documented under my assessment and plan.    Marvia Pickles, PA-C  [facility] AT [facility] [address] [place] [address] Dept: [phone] Dept Fax: [phone]   Orders Placed This Encounter  Procedures  . [place] CHEST SOFT TISSUE     Standing Status:   Future    Standing Expiration Date:   09/04/2023    Scheduling Instructions:     RH    Order Specific Question:   Reason for Exam (SYMPTOM  OR DIAGNOSIS REQUIRED)    Answer:   soft mass left lateral posterior thorax at end of tram flap incicison    Order Specific Question:   Preferred imaging location?    Answer:   External      CHIEF COMPLAINT:  CC: Soft tissue mass of the left posterolateral chest wall  Current Treatment: Evaluation  HISTORY OF PRESENT ILLNESS:   Oncology History  Bilateral breast cancer ([place])  11/22/2007 Initial Diagnosis   Breast cancer in female [facility])   07/25/2008 Cancer Staging   Staging form: Breast, AJCC 6th Edition - Clinical stage from 07/25/2008: Stage IIA (T2, N0, M0) - Signed by [name], MD on 12/15/2021 Staged by: Staging assigned at another facility Diagnostic confirmation: Positive histology Specimen type: Excision Laterality: Right Stage prefix: Initial diagnosis   Breast cancer 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 lymph node, left ([place])  11/22/2007 Initial Diagnosis   Breast cancer 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 lymph node, left ([place])   07/25/2008 Cancer Staging   Staging form: Breast, AJCC 6th Edition - Clinical stage from 07/25/2008: Stage IIA (T1, N1, M0) - Signed by [name], MD on 12/15/2021 Staged by: Staging assigned at another facility Diagnostic confirmation: Positive histology Specimen type: Excision Laterality: Left Total positive nodes: 2 Stage prefix: Initial diagnosis Staging comments:  Adjuvant DD AC followed by Taxol, tamoxifen x 3 years, Letrozole x 7 yrs       INTERVAL HISTORY:  Laura Hill is added to the schedule today, as she telephoned reporting a mass of the left chest wall which she noted earlier this week.  She states this area is not painful. She denies fevers or chills. She denies pain. Her appetite is good, but she has been trying to lose weight. Her weight has decreased 29 pounds over last 9 months  .  REVIEW OF SYSTEMS:  Review of Systems  Constitutional:  Positive for fatigue. Negative for appetite change, chills, fever and unexpected weight change.  HENT:   Negative for lump/mass, mouth sores and sore throat.   Respiratory:  Negative for cough and shortness of breath.   Cardiovascular:  Negative for chest pain and leg swelling.  Gastrointestinal:  Negative for abdominal pain, constipation, diarrhea, nausea and vomiting.  Endocrine: Negative for hot flashes.  Genitourinary:  Negative for difficulty urinating, dysuria, frequency and hematuria.   Musculoskeletal:  Positive for arthralgias and back pain. Negative for myalgias.  Skin:  Negative for rash.  Neurological:  Negative for dizziness and headaches.  Hematological:  Negative for adenopathy. Does not bruise/bleed easily.  Psychiatric/Behavioral:  Negative for depression and sleep disturbance. The patient is not nervous/anxious.      VITALS:  Blood pressure 139/71, pulse 80, temperature 98.3 F (36.8 C), temperature source Oral, resp. rate 20, height _0  (1.676 m), weight 207 lb 6.4 oz (94.1 kg), SpO2 98 %.  Wt Readings from Last 3 Encounters:  09/03/22 207 lb 6.4 oz (94.1 kg)  03/10/22 236 lb (107 kg)  12/23/21 240 lb (108.9 kg)    Body mass index is 33.48 kg/m.  Performance status (ECOG): 1 - Symptomatic but completely ambulatory  PHYSICAL EXAM:  Physical Exam Vitals and nursing note reviewed.  Constitutional:      General: She is not in acute distress.    Appearance: Normal appearance.  HENT:     Head: Normocephalic and atraumatic.     Mouth/Throat:     Mouth: Mucous membranes are moist.     Pharynx: Oropharynx is clear. No oropharyngeal exudate or posterior oropharyngeal erythema.  Eyes:     General: No scleral icterus.    Extraocular Movements: Extraocular movements intact.     Conjunctiva/sclera: Conjunctivae normal.     Pupils: Pupils are equal, round, and reactive to light.  Cardiovascular:     Rate  and Rhythm: Normal rate and regular rhythm.     Heart sounds: Normal heart sounds. No murmur heard.    No friction rub. No gallop.  Pulmonary:     Effort: Pulmonary effort is normal.     Breath sounds: Normal breath sounds. No wheezing, rhonchi or rales.  Chest:     Chest wall: Mass (Soft tissue mass of the left posterolateral chest wall just medial to her TRAM flap incision measuring about 2 x 3 cm) present.  Breasts:    Right: Absent.     Left: Absent.     Comments: Bilateral reconstructions are negative Abdominal:     General: There is no distension.     Palpations: Abdomen is soft. There is no hepatomegaly, splenomegaly or mass.     Tenderness: There is no abdominal tenderness.  Musculoskeletal:        General: Normal range of motion.     Cervical back: Normal range of motion and neck supple. No tenderness.     Right  lower leg: No edema.     Left lower leg: No edema.  Lymphadenopathy:     Cervical: No cervical adenopathy.     Upper Body:     Right upper body: No supraclavicular or axillary adenopathy.     Left upper body: No supraclavicular or axillary adenopathy.     Lower Body: No right inguinal adenopathy. No left inguinal adenopathy.  Skin:    General: Skin is warm and dry.     Coloration: Skin is not jaundiced.     Findings: No rash.  Neurological:     Mental Status: She is alert and oriented to person, place, and time.     Cranial Nerves: No cranial nerve deficit.  Psychiatric:        Mood and Affect: Mood normal.        Behavior: Behavior normal.        Thought Content: Thought content normal.    LABS:       No data to display             No data to display           Lab Results  Component Value Date   CEA1 2.7 12/05/2021   /  CEA  Date Value Ref Range Status  12/05/2021 2.7 0.0 - 4.7 ng/mL Final    Comment:    (NOTE)                             Nonsmokers          <3.9                             Smokers             <5.6 [employer]  Electrochemiluminescence Immunoassay (ECLIA) Values obtained with different assay methods or kits cannot be used interchangeably.  Results cannot be interpreted as absolute evidence of the presence or absence of malignant disease. Performed At: [facility] [place], [place] [id number] Rush Farmer MD [id number]    No results found for: "PSA1" No results found for: "CAN199" Lab Results  Component Value Date   CAN125 5.1 12/05/2021    No results found for: "TOTALPROTELP", "ALBUMINELP", "A1GS", "A2GS", "BETS", "BETA2SER", "GAMS", "MSPIKE", "SPEI" No results found for: "TIBC", "FERRITIN", "IRONPCTSAT" No results found for: "LDH"  STUDIES:  No results found.    HISTORY:   Past Medical History:  Diagnosis Date  . Breast cancer ([place])   . DDD (degenerative disc disease), cervical   . Fibromyalgia   . Hashimoto's disease   . Hypercholesteremia   . Irritable bowel syndrome (IBS)   . Mass of soft tissue of chest 09/03/2022  . Migraine, cervicogenic   . Osteoporosis   . Thyroid disease     Past Surgical History:  Procedure Laterality Date  . ABDOMINAL HYSTERECTOMY    . BREAST RECONSTRUCTION Bilateral 2010  . DILATATION AND CURETTAGE/HYSTEROSCOPY WITH MINERVA    . LEG SURGERY    . MASTECTOMY Bilateral 2009  . SALPINGOOPHORECTOMY Bilateral     Family History  Problem Relation Age of Onset  . Colon cancer Mother 26  . Multiple myeloma Mother 35  . Prostate cancer Father 73  . Breast cancer Sister 70  . Cancer Brother 15       oral  . Breast cancer Brother 18  . Skin cancer Maternal Aunt  21s  . Colon cancer Maternal Uncle 37  . Stomach cancer Maternal Uncle 75  . Breast cancer Paternal Aunt 24  . Breast cancer Paternal Grandmother 24  . Melanoma Cousin 18       maternal    Social History:  reports that she has never smoked. She has never used smokeless tobacco. She reports that she does not drink alcohol and does not use  drugs.The patient is alone today.  Allergies:  Allergies  Allergen Reactions  . Bactrim [Sulfamethoxazole-Trimethoprim] Hives    Facial swelling with hives     Current Medications: Current Outpatient Medications  Medication Sig Dispense Refill  . levothyroxine (SYNTHROID) 88 MCG tablet Take 88 mcg by mouth daily before breakfast.    . BOOSTRIX 5-2.5-18.5 LF-MCG/0.5 injection     . cefdinir (OMNICEF) 300 MG capsule Take 300 mg by mouth 2 (two) times daily.    Marland Kitchen dicyclomine (BENTYL) 20 MG tablet Take 20 mg by mouth as directed.    . DULoxetine (CYMBALTA) 60 MG capsule Take 60 mg by mouth daily.    Marland Kitchen HYDROcodone-acetaminophen (NORCO/VICODIN) 5-325 MG tablet Take 1 tablet by mouth daily as needed.    . hydrocortisone (ANUSOL-HC) 2.5 % rectal cream Place 1 application rectally 2 (two) times daily. Apply externally twice a day for 2 weeks 30 g 2  . linaCLOtide (LINZESS PO) Take by mouth as needed.    Marland Kitchen omeprazole (PRILOSEC) 40 MG capsule Take 40 mg by mouth daily.    Marland Kitchen OZEMPIC, 0.25 OR 0.5 MG/DOSE, 2 MG/3ML SOPN Inject into the skin.    . pregabalin (LYRICA) 100 MG capsule Take 100 mg by mouth daily.    . rosuvastatin (CRESTOR) 20 MG tablet Take 20 mg by mouth daily.    . tamsulosin (FLOMAX) 0.4 MG CAPS capsule Take 0.4 mg by mouth at bedtime.    Marland Kitchen tiZANidine (ZANAFLEX) 4 MG tablet Take 4 mg by mouth daily.     No current facility-administered medications for this visit.

## 2022-09-03 NOTE — Telephone Encounter (Addendum)
Pt will be here @ 330pm. ----- Message from Marvia Pickles, PA-C sent at 09/03/2022  8:24 AM EDT ----- Regarding: RE: Found lump in axilla Contact: [phone] How about 3:30 today so Dr. Bishop Dublin will still be here, thanks ----- Message ----- From: [name], MD Sent: 09/02/2022   6:00 PM EDT To: Dairl Ponder, RN; Marvia Pickles, PA-C Subject: RE: Found lump in axilla                       Perhaps Vida Roller could see, order the w/u and I could see after the tests are done to review results ----- Message ----- From: Dairl Ponder, RN Sent: 09/02/2022   4:38 PM EDT To: [name], MD Subject: Found lump in axilla                           Pt has called to report she found a lump on left axilla, toward the back. It is the same side of her left triple negative breast cancer in 2009. She would like to be seen by someone.

## 2022-09-03 NOTE — Assessment & Plan Note (Signed)
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 She received dose dense AC followed by weekly Taxol, as well as endocrine therapy for a total of 10 years. She has undergone bilateral mastectomies with reconstruction.  There was a tiny hypodense lesion of the left lobe of the liver, which is too small to accurately characterize, but statistically likely to reflect cyst.  We planned to follow this.  New soft tissue mass of the left posterolateral chest wall near the TRAM flap incision.  This is most consistent with a lipoma, but we will obtain ultrasound of the area for further evaluation.  As long as this is benign.  I will plan to see her back in January with repeat CT abdomen to reevaluate the liver lesion.

## 2022-09-03 NOTE — Assessment & Plan Note (Signed)
New soft tissue mass posterolateral chest wall near end of tram flap incision. This is most consistent with a lipoma. We will evaluate with ultrasound.  We will plan to call her with the results.

## 2022-09-04 ENCOUNTER — Ambulatory Visit: Payer: Medicare HMO | Admitting: Hematology and Oncology

## 2022-09-04 ENCOUNTER — Encounter: Payer: Self-pay | Admitting: Hematology and Oncology

## 2022-09-04 DIAGNOSIS — R222 Localized swelling, mass and lump, trunk: Secondary | ICD-10-CM | POA: Diagnosis not present

## 2022-09-04 DIAGNOSIS — M7989 Other specified soft tissue disorders: Secondary | ICD-10-CM | POA: Diagnosis not present

## 2022-09-08 ENCOUNTER — Ambulatory Visit: Payer: Medicare HMO | Admitting: Dietician

## 2022-09-08 DIAGNOSIS — Z853 Personal history of malignant neoplasm of breast: Secondary | ICD-10-CM | POA: Diagnosis not present

## 2022-09-08 DIAGNOSIS — J329 Chronic sinusitis, unspecified: Secondary | ICD-10-CM | POA: Diagnosis not present

## 2022-09-08 DIAGNOSIS — J341 Cyst and mucocele of nose and nasal sinus: Secondary | ICD-10-CM | POA: Diagnosis not present

## 2022-09-08 NOTE — Progress Notes (Signed)
Nutrition Assessment   Reason for Assessment: MST screen for weight loss.    ASSESSMENT: Patient is 61 year old female with history of bilateral stage IIA breast cancers diagnosed in 2009.  She underwent bilateral mastectomies with reconstruction.  She was recently in for follow up and a new soft tissue mass was discovered which is believed to be a lopoma. She reports her weight loss was intentional and the result of reduced appetite from [place].  She eats lots of whole grains fruits and vegetables.  Has supportive husband that has been cooking for her. Usual meals:  Cereal milk with cereal  Salad Meat and 2 veg Takes calcium and last bone scan was normal.   Anthropometrics: 29# weight loss over past 6 months  Height: 66" Weight:  09/03/22  207.4# 03/10/22  236# 12/23/21  240# UBW: said has lost 50# BMI: 33.48  INTERVENTION: Encouraged slow and steady weight loss between 5-10#/month to preserve LBM   Emailed Nutrition Tip sheet  for  Breast Cancer Survivors with contact information provided  Next Visit: PRN at patient or provider request  April Manson, RDN, LDN Registered Dietitian, [place] (Usual office hours: Tuesday-Thursday) Cell: [phone]

## 2022-09-10 ENCOUNTER — Telehealth: Payer: Self-pay

## 2022-09-10 ENCOUNTER — Other Ambulatory Visit: Payer: Self-pay

## 2022-09-10 MED ORDER — LEVOTHYROXINE SODIUM 88 MCG PO TABS: 88.0000 ug | ORAL_TABLET | Freq: Every day | ORAL | 0 refills | Status: AC

## 2022-09-10 NOTE — Telephone Encounter (Signed)
-----   Message from Marvia Pickles, PA-C sent at 09/10/2022  4:09 PM EDT ----- Please let her know ultrasound was consistent with a benign lipoma as Dr. Bishop Dublin and I suspected. Thanks

## 2022-09-10 NOTE — Telephone Encounter (Signed)
Patient made aware and voiced understanding.

## 2022-09-15 DIAGNOSIS — J342 Deviated nasal septum: Secondary | ICD-10-CM | POA: Diagnosis not present

## 2022-09-15 DIAGNOSIS — J343 Hypertrophy of nasal turbinates: Secondary | ICD-10-CM | POA: Diagnosis not present

## 2022-09-15 DIAGNOSIS — J320 Chronic maxillary sinusitis: Secondary | ICD-10-CM | POA: Diagnosis not present

## 2022-09-15 DIAGNOSIS — J323 Chronic sphenoidal sinusitis: Secondary | ICD-10-CM | POA: Diagnosis not present

## 2022-09-15 DIAGNOSIS — E039 Hypothyroidism, unspecified: Secondary | ICD-10-CM | POA: Diagnosis not present

## 2022-09-15 DIAGNOSIS — R0981 Nasal congestion: Secondary | ICD-10-CM | POA: Diagnosis not present

## 2022-10-06 DIAGNOSIS — M549 Dorsalgia, unspecified: Secondary | ICD-10-CM | POA: Diagnosis not present

## 2022-10-06 DIAGNOSIS — M5416 Radiculopathy, lumbar region: Secondary | ICD-10-CM | POA: Diagnosis not present

## 2022-10-06 DIAGNOSIS — G894 Chronic pain syndrome: Secondary | ICD-10-CM | POA: Diagnosis not present

## 2022-10-06 DIAGNOSIS — M47816 Spondylosis without myelopathy or radiculopathy, lumbar region: Secondary | ICD-10-CM | POA: Diagnosis not present

## 2022-10-06 DIAGNOSIS — M797 Fibromyalgia: Secondary | ICD-10-CM | POA: Diagnosis not present

## 2022-10-06 DIAGNOSIS — Z1389 Encounter for screening for other disorder: Secondary | ICD-10-CM | POA: Diagnosis not present

## 2022-10-06 DIAGNOSIS — M5136 Other intervertebral disc degeneration, lumbar region: Secondary | ICD-10-CM | POA: Diagnosis not present

## 2022-10-06 DIAGNOSIS — M5030 Other cervical disc degeneration, unspecified cervical region: Secondary | ICD-10-CM | POA: Diagnosis not present

## 2022-10-30 DIAGNOSIS — E063 Autoimmune thyroiditis: Secondary | ICD-10-CM | POA: Diagnosis not present

## 2022-10-30 DIAGNOSIS — Z01818 Encounter for other preprocedural examination: Secondary | ICD-10-CM | POA: Diagnosis not present

## 2022-10-30 DIAGNOSIS — Z6831 Body mass index (BMI) 31.0-31.9, adult: Secondary | ICD-10-CM | POA: Diagnosis not present

## 2022-10-30 DIAGNOSIS — E038 Other specified hypothyroidism: Secondary | ICD-10-CM | POA: Diagnosis not present

## 2022-11-02 DIAGNOSIS — J988 Other specified respiratory disorders: Secondary | ICD-10-CM | POA: Diagnosis not present

## 2022-11-15 DIAGNOSIS — Z0100 Encounter for examination of eyes and vision without abnormal findings: Secondary | ICD-10-CM | POA: Diagnosis not present

## 2022-11-16 HISTORY — DX: Other disorders of bilirubin metabolism: E80.6

## 2022-12-04 ENCOUNTER — Inpatient Hospital Stay: Payer: Medicare HMO

## 2022-12-04 ENCOUNTER — Encounter: Payer: Self-pay | Admitting: Oncology

## 2022-12-04 ENCOUNTER — Other Ambulatory Visit: Payer: Self-pay | Admitting: Oncology

## 2022-12-04 ENCOUNTER — Inpatient Hospital Stay: Payer: Medicare HMO | Attending: Oncology | Admitting: Oncology

## 2022-12-04 VITALS — BP 125/64 | HR 81 | Temp 97.9°F | Resp 16 | Ht 66.0 in | Wt 209.1 lb

## 2022-12-04 DIAGNOSIS — C50912 Malignant neoplasm of unspecified site of left female breast: Secondary | ICD-10-CM

## 2022-12-04 DIAGNOSIS — Z170 Estrogen receptor positive status [ER+]: Secondary | ICD-10-CM

## 2022-12-04 DIAGNOSIS — Z79899 Other long term (current) drug therapy: Secondary | ICD-10-CM | POA: Diagnosis not present

## 2022-12-04 DIAGNOSIS — C50011 Malignant neoplasm of nipple and areola, right female breast: Secondary | ICD-10-CM

## 2022-12-04 DIAGNOSIS — Z1501 Genetic susceptibility to malignant neoplasm of breast: Secondary | ICD-10-CM | POA: Diagnosis not present

## 2022-12-04 DIAGNOSIS — Z90722 Acquired absence of ovaries, bilateral: Secondary | ICD-10-CM | POA: Insufficient documentation

## 2022-12-04 DIAGNOSIS — Z853 Personal history of malignant neoplasm of breast: Secondary | ICD-10-CM | POA: Insufficient documentation

## 2022-12-04 DIAGNOSIS — C773 Secondary and unspecified malignant neoplasm of axilla and upper limb lymph nodes: Secondary | ICD-10-CM

## 2022-12-04 DIAGNOSIS — C50012 Malignant neoplasm of nipple and areola, left female breast: Secondary | ICD-10-CM | POA: Diagnosis not present

## 2022-12-04 DIAGNOSIS — Z90710 Acquired absence of both cervix and uterus: Secondary | ICD-10-CM | POA: Insufficient documentation

## 2022-12-04 DIAGNOSIS — Z1502 Genetic susceptibility to malignant neoplasm of ovary: Secondary | ICD-10-CM | POA: Diagnosis not present

## 2022-12-04 DIAGNOSIS — Z1509 Genetic susceptibility to other malignant neoplasm: Secondary | ICD-10-CM

## 2022-12-04 DIAGNOSIS — Z9013 Acquired absence of bilateral breasts and nipples: Secondary | ICD-10-CM | POA: Diagnosis not present

## 2022-12-04 LAB — CBC WITH DIFFERENTIAL (CANCER CENTER ONLY)
Abs Immature Granulocytes: 0.01 10*3/uL (ref 0.00–0.07)
Basophils Absolute: 0.1 10*3/uL (ref 0.0–0.1)
Basophils Relative: 1 %
Eosinophils Absolute: 0.5 10*3/uL (ref 0.0–0.5)
Eosinophils Relative: 8 %
HCT: 43.4 % (ref 36.0–46.0)
Hemoglobin: 14.1 g/dL (ref 12.0–15.0)
Immature Granulocytes: 0 %
Lymphocytes Relative: 32 %
Lymphs Abs: 2.1 10*3/uL (ref 0.7–4.0)
MCH: 29.2 pg (ref 26.0–34.0)
MCHC: 32.5 g/dL (ref 30.0–36.0)
MCV: 89.9 fL (ref 80.0–100.0)
Monocytes Absolute: 0.4 10*3/uL (ref 0.1–1.0)
Monocytes Relative: 6 %
Neutro Abs: 3.4 10*3/uL (ref 1.7–7.7)
Neutrophils Relative %: 53 %
Platelet Count: 243 10*3/uL (ref 150–400)
RBC: 4.83 MIL/uL (ref 3.87–5.11)
RDW: 13.6 % (ref 11.5–15.5)
WBC Count: 6.4 10*3/uL (ref 4.0–10.5)
nRBC: 0 % (ref 0.0–0.2)

## 2022-12-04 LAB — CMP (CANCER CENTER ONLY)
ALT: 15 U/L (ref 0–44)
AST: 19 U/L (ref 15–41)
Albumin: 3.9 g/dL (ref 3.5–5.0)
Alkaline Phosphatase: 67 U/L (ref 38–126)
Anion gap: 6 (ref 5–15)
BUN: 15 mg/dL (ref 8–23)
CO2: 24 mmol/L (ref 22–32)
Calcium: 8.4 mg/dL — ABNORMAL LOW (ref 8.9–10.3)
Chloride: 108 mmol/L (ref 98–111)
Creatinine: 0.94 mg/dL (ref 0.44–1.00)
GFR, Estimated: >60 mL/min (ref >60)
Glucose, Bld: 81 mg/dL (ref 70–99)
Potassium: 3.5 mmol/L (ref 3.5–5.1)
Sodium: 138 mmol/L (ref 135–145)
Total Bilirubin: 1.6 mg/dL — ABNORMAL HIGH (ref 0.3–1.2)
Total Protein: 6.7 g/dL (ref 6.5–8.1)

## 2022-12-04 NOTE — Progress Notes (Signed)
[facility]  [address] [place],  [place]  [address] [phone]  Clinic Day: 12/04/22  Referring physician: [name], MD  ASSESSMENT & PLAN:   History of bilateral stage IIA breast cancers, (T2 N0 M0) on the right and (T1 N1 M0) on the left, diagnosed in 2009. 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 She received dose dense AC followed by weekly Taxol as well as endocrine therapy for a total of 10 years. She has undergone bilateral mastectomies with reconstruction. She remains without evidence of obvious recurrence.  BRCA2 mutation. Therefore, she has undergone bilateral mastectomies as well as hysterectomy and bilateral salpingo oophorectomy.  History of colon cancer and pancreatic cancer in the family. She did have a colonoscopy and EGD last year and the colon polyp of the transverse colon was hyperplastic. Dr. [name] has recommended repeat in 3-5 years. She is concerned because previously she has had adenomatous polyps.  Upper abdominal pain, intermittent for the last 6 months. I just feel a non specific firmness in the left epigastric area. However, with her family history of colon and pancreatic cancer as well as BRCA2 mutation, I have recommended a CT scan of abdomen and pelvis. I will get that scheduled and call her with the results. We will also draw labs today.   Plan We will draw routine labs today and schedule her a CT of abdomen and pelvis to evaluate her abdominal pain, especially in view of her positive family history for colon and pancreatic cancer. I will call her with these results. If all is well, I can see her back in 6 months with CBC and CMP. She understand and agrees with this plan of care. I have answered her questions and she knows to call with any concerns.  I provided 15 minutes of face-to-face time during this this encounter and > 50% was spent counseling as documented under my assessment and  plan.    [name], MD [facility] [address] [place] [address] Dept: [phone] Dept Fax: [phone]    CHIEF COMPLAINT:  CC: History of bilateral stage IIA breast cancers; BRCA2 positive  Current Treatment:  Surveillance   HISTORY OF PRESENT ILLNESS:  Laura Hill is a 62 y.o. female who presents as a transfer of care for the continued evaluation and management due to her history of bilateral stage IIA breast cancers, (T2 N0 M0) on the right and (T1 N1 M0) on the left. 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 This was originally diagnosed in 2009. She did receive dose dense AC followed by 12 weeks of weekly Taxol. Due to her BRCA2 positivity, she underwent bilateral mastectomies as well as hysterectomy and bilateral salpingo oophorectomy. She also received endocrine therapy with tamoxifen for 3 years followed by 7 years of letrozole. She did receive Boneva for osteopenia while on letrozole, and most recent bone density from May 2022 was normal. She did undergo bilateral breast reconstructions in 2010.  Lab evaluation from December 2022 revealed a normal CBC and CMP was unremarkable. 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 There is a tiny hypodense lesion of the left lobe of the liver, which is too small to  accurately characterize, but statistically likely to reflect cyst. I reviewed the images with the patient and have difficulty even seeing this lesion. MRI pelvis from December 23rd revealed sigmoid diverticulosis, without evidence of active diverticulitis. No definite evidence of colovaginal fistula.   INTERVAL HISTORY:  I have reviewed her chart and  materials related to her cancer extensively and collaborated history with the patient. Summary of oncologic history is as follows: Oncology History  Bilateral breast cancer ([facility])  11/22/2007 Initial Diagnosis   Breast cancer in female [facility])   07/25/2008 Cancer Staging   Staging form: Breast, AJCC 6th Edition - Clinical stage from 07/25/2008: Stage IIA (T2, N0, M0) - Signed by [name], MD on 12/15/2021 Staged by: Staging assigned at another facility Diagnostic confirmation: Positive histology Specimen type: Excision Laterality: Right Stage prefix: Initial diagnosis   Breast cancer 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 lymph node, left ([facility])  11/22/2007 Initial Diagnosis   Breast cancer 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 lymph node, left ([facility])   07/25/2008 Cancer Staging   Staging form: Breast, AJCC 6th Edition - Clinical stage from 07/25/2008: Stage IIA (T1, N1, M0) - Signed by [name], MD on 12/15/2021 Staged by: Staging assigned at another facility Diagnostic confirmation: Positive histology Specimen type: Excision Laterality: Left Total positive nodes: 2 Stage prefix: Initial diagnosis Staging comments: Adjuvant DD AC followed by Taxol, tamoxifen x 3 years, Letrozole x 7 yrs   See summary above for Oncology history   Laura Hill is here today for a follow up for her history of bilateral stage IIA breast cancers; BRCA2 positive. She also has esophageal stenosis and has required dilatation in the past for dysphagia. She does have Hashimoto's disease and is on observation only. She does have fibromyalgia and degenerative disc disease. She has borderline sleep apnea.  She states that she has some pain in her knees and in the upper abdomen that is intermittent for the past 6 months. Her brother was recently diagnosed in February, 2023 with stage IV pancreatic cancer. She has had a hysterectomy and BSO. Bilateral reconstructions are negative however, she has mild firmness to the left side of the  epigastrium, which is non specific. I will draw a CBC and CMP today, and schedule her for a CT of abdomen and pelvis. Dr. [name] did a endoscopy and colonoscopy in 2023.  Her last Dexa was in May, 2022 and it was normal.  She denies signs of infection such as sore throat, sinus drainage, cough, or urinary symptoms.  She denies fevers or recurrent chills. She denies pain. She denies nausea, vomiting, chest pain, dyspnea or cough. Her appetite is great and weight has increased 2 pounds over last 3 months .  HISTORY:   Past Medical History:  Diagnosis Date   Breast cancer ([facility])    DDD (degenerative disc disease), cervical    Fibromyalgia    Hashimoto's disease    Hypercholesteremia    Irritable bowel syndrome (IBS)    Mass of soft tissue of chest 09/03/2022   Migraine, cervicogenic    Osteoporosis    Thyroid disease     Past Surgical History:  Procedure Laterality Date   ABDOMINAL HYSTERECTOMY     BREAST RECONSTRUCTION Bilateral 2010   DILATATION AND CURETTAGE/HYSTEROSCOPY WITH MINERVA     LEG SURGERY     MASTECTOMY Bilateral 2009   SALPINGOOPHORECTOMY Bilateral     Family History  Problem Relation Age of Onset   Colon cancer Mother 73   Multiple myeloma Mother 2   Prostate  cancer Father 39   Breast cancer Sister 52   Cancer Brother 43       oral   Breast cancer Brother 91   Skin cancer Maternal Aunt        88s   Colon cancer Maternal Uncle 76   Stomach cancer Maternal Uncle 75   Breast cancer Paternal Aunt 53   Breast cancer Paternal Grandmother 69   Melanoma Cousin 59       maternal    Social History:  reports that she has never smoked. She has never used smokeless tobacco. She reports that she does not drink alcohol and does not use drugs.The patient is alone today. She is married and lives at home with her spouse. She has 2 biological children and has adopted two of her grandchildren. She works, and has never been exposed to chemicals or other toxic  agents.  Allergies:  Allergies  Allergen Reactions   Bactrim [Sulfamethoxazole-Trimethoprim] Hives    Facial swelling with hives     Current Medications: Current Outpatient Medications  Medication Sig Dispense Refill   dicyclomine (BENTYL) 20 MG tablet Take 20 mg by mouth as directed.     DULoxetine (CYMBALTA) 60 MG capsule Take 60 mg by mouth daily.     HYDROcodone-acetaminophen (NORCO/VICODIN) 5-325 MG tablet Take 1 tablet by mouth daily as needed.     hydrocortisone (ANUSOL-HC) 2.5 % rectal cream Place 1 application rectally 2 (two) times daily. Apply externally twice a day for 2 weeks 30 g 2   levothyroxine (SYNTHROID) 88 MCG tablet Take 1 tablet (88 mcg total) by mouth daily before breakfast. 90 tablet 0   linaCLOtide (LINZESS PO) Take by mouth as needed.     omeprazole (PRILOSEC) 40 MG capsule Take 40 mg by mouth daily.     OZEMPIC, 0.25 OR 0.5 MG/DOSE, 2 MG/3ML SOPN Inject into the skin.     pregabalin (LYRICA) 100 MG capsule Take 100 mg by mouth daily.     rosuvastatin (CRESTOR) 20 MG tablet Take 20 mg by mouth daily.     tamsulosin (FLOMAX) 0.4 MG CAPS capsule Take 0.4 mg by mouth at bedtime.     tiZANidine (ZANAFLEX) 4 MG tablet Take 4 mg by mouth daily.     No current facility-administered medications for this visit.    REVIEW OF SYSTEMS:  Review of Systems  Constitutional: Negative.  Negative for appetite change, chills, diaphoresis, fatigue, fever and unexpected weight change.  HENT:  Negative.  Negative for hearing loss, lump/mass, mouth sores, nosebleeds, sore throat, tinnitus, trouble swallowing and voice change.   Eyes: Negative.  Negative for eye problems and icterus.  Respiratory: Negative.  Negative for chest tightness, cough, hemoptysis, shortness of breath and wheezing.   Cardiovascular: Negative.  Negative for chest pain, leg swelling and palpitations.  Gastrointestinal:  Positive for abdominal pain (upper abdominal discomfort). Negative for abdominal  distention, blood in stool, constipation, diarrhea, nausea, rectal pain and vomiting.  Endocrine: Negative.   Genitourinary: Negative.  Negative for bladder incontinence, difficulty urinating, dyspareunia, dysuria, frequency, hematuria, menstrual problem, nocturia, pelvic pain, vaginal bleeding and vaginal discharge.   Musculoskeletal: Negative.  Negative for arthralgias, back pain, flank pain, gait problem, myalgias, neck pain and neck stiffness.  Skin: Negative.  Negative for itching, rash and wound.  Neurological: Negative.  Negative for dizziness, extremity weakness, gait problem, headaches, light-headedness, numbness, seizures and speech difficulty.  Hematological: Negative.  Negative for adenopathy. Does not bruise/bleed easily.  Psychiatric/Behavioral: Negative.  Negative for confusion,  decreased concentration, depression, sleep disturbance and suicidal ideas. The patient is not nervous/anxious.       VITALS:  Blood pressure 125/64, pulse 81, temperature 97.9 F (36.6 C), temperature source Oral, resp. rate 16, height '5\' 6"'$  (1.676 m), weight 209 lb 1.6 oz (94.8 kg), SpO2 98 %.  Wt Readings from Last 3 Encounters:  12/04/22 209 lb 1.6 oz (94.8 kg)  09/03/22 207 lb 6.4 oz (94.1 kg)  03/10/22 236 lb (107 kg)    Body mass index is 33.75 kg/m.  Performance status (ECOG): 1 - Symptomatic but completely ambulatory  PHYSICAL EXAM:  Physical Exam Vitals and nursing note reviewed.  Constitutional:      General: She is not in acute distress.    Appearance: Normal appearance. She is normal weight. She is not ill-appearing, toxic-appearing or diaphoretic.  HENT:     Head: Normocephalic and atraumatic.     Right Ear: Tympanic membrane, ear canal and external ear normal. There is no impacted cerumen.     Left Ear: Tympanic membrane, ear canal and external ear normal. There is no impacted cerumen.     Nose: Nose normal. No congestion or rhinorrhea.     Mouth/Throat:     Mouth: Mucous  membranes are moist.     Pharynx: Oropharynx is clear. No oropharyngeal exudate or posterior oropharyngeal erythema.  Eyes:     General: No scleral icterus.       Right eye: No discharge.        Left eye: No discharge.     Extraocular Movements: Extraocular movements intact.     Conjunctiva/sclera: Conjunctivae normal.     Pupils: Pupils are equal, round, and reactive to light.  Neck:     Vascular: No carotid bruit.  Cardiovascular:     Rate and Rhythm: Normal rate and regular rhythm.     Pulses: Normal pulses.     Heart sounds: Normal heart sounds. No murmur heard.    No friction rub. No gallop.  Pulmonary:     Effort: Pulmonary effort is normal. No respiratory distress.     Breath sounds: No stridor. No wheezing, rhonchi or rales.  Chest:     Chest wall: No tenderness.     Comments: Bilateral reconstructions are negative. Abdominal:     General: Bowel sounds are normal. There is no distension.     Palpations: Abdomen is soft. There is no hepatomegaly, splenomegaly or mass.     Tenderness: There is no abdominal tenderness. There is no right CVA tenderness, left CVA tenderness, guarding or rebound.     Hernia: No hernia is present.     Comments: She has mild firmness to the left side of the epigastrium, which is non specific.  Musculoskeletal:        General: No swelling, tenderness, deformity or signs of injury. Normal range of motion.     Cervical back: Normal range of motion and neck supple. No rigidity or tenderness.     Right lower leg: No edema.     Left lower leg: No edema.  Lymphadenopathy:     Cervical: No cervical adenopathy.  Skin:    General: Skin is warm and dry.     Coloration: Skin is not jaundiced or pale.     Findings: No bruising, erythema, lesion or rash.  Neurological:     General: No focal deficit present.     Mental Status: She is alert and oriented to person, place, and time. Mental status is at baseline.  Cranial Nerves: No cranial nerve deficit.      Sensory: No sensory deficit.     Motor: No weakness.     Coordination: Coordination normal.     Gait: Gait normal.     Deep Tendon Reflexes: Reflexes normal.  Psychiatric:        Mood and Affect: Mood normal.        Behavior: Behavior normal.        Thought Content: Thought content normal.        Judgment: Judgment normal.      LABS:      Latest Ref Rng & Units 12/04/2022    4:03 PM  CBC  WBC 4.0 - 10.5 K/uL 6.4   Hemoglobin 12.0 - 15.0 g/dL 14.1   Hematocrit 36.0 - 46.0 % 43.4   Platelets 150 - 400 K/uL 243       Latest Ref Rng & Units 12/04/2022    4:03 PM  CMP  Glucose 70 - 99 mg/dL 81   BUN 8 - 23 mg/dL 15   Creatinine 0.44 - 1.00 mg/dL 0.94   Sodium 135 - 145 mmol/L 138   Potassium 3.5 - 5.1 mmol/L 3.5   Chloride 98 - 111 mmol/L 108   CO2 22 - 32 mmol/L 24   Calcium 8.9 - 10.3 mg/dL 8.4   Total Protein 6.5 - 8.1 g/dL 6.7   Total Bilirubin 0.3 - 1.2 mg/dL 1.6   Alkaline Phos 38 - 126 U/L 67   AST 15 - 41 U/L 19   ALT 0 - 44 U/L 15    Component Ref Range & Units 8 mo ago  Free T4 0.60 - 1.60 ng/dL 1.19   Component Ref Range & Units 8 mo ago  TSH 0.35 - 5.50 uIU/mL 0.01 Low      Lab Results  Component Value Date   CEA1 2.7 12/05/2021   /  CEA  Date Value Ref Range Status  12/05/2021 2.7 0.0 - 4.7 ng/mL Final    Comment:    (NOTE)                             Nonsmokers          <3.9                             Smokers             <5.6 [employer] Electrochemiluminescence Immunoassay (ECLIA) Values obtained with different assay methods or kits cannot be used interchangeably.  Results cannot be interpreted as absolute evidence of the presence or absence of malignant disease. Performed At: [facility] [place], [place] [id number] Rush Farmer MD MO:[id number]    No results found for: "PSA1" No results found for: "CAN199" Lab Results  Component Value Date   CAN125 5.1 12/05/2021    No results found for:  "TOTALPROTELP", "ALBUMINELP", "A1GS", "A2GS", "BETS", "BETA2SER", "GAMS", "MSPIKE", "SPEI" No results found for: "TIBC", "FERRITIN", "IRONPCTSAT" No results found for: "LDH"  STUDIES:     I,Jasmine M Lassiter,acting as a scribe for [name], MD.,have documented all relevant documentation on the behalf of [name], MD,as directed by  [name], MD while in the presence of [name], MD.

## 2022-12-05 ENCOUNTER — Other Ambulatory Visit: Payer: Self-pay | Admitting: Internal Medicine

## 2022-12-11 DIAGNOSIS — C50012 Malignant neoplasm of nipple and areola, left female breast: Secondary | ICD-10-CM | POA: Diagnosis not present

## 2022-12-11 DIAGNOSIS — R1012 Left upper quadrant pain: Secondary | ICD-10-CM | POA: Diagnosis not present

## 2022-12-17 DIAGNOSIS — M47816 Spondylosis without myelopathy or radiculopathy, lumbar region: Secondary | ICD-10-CM | POA: Diagnosis not present

## 2022-12-17 DIAGNOSIS — M549 Dorsalgia, unspecified: Secondary | ICD-10-CM | POA: Diagnosis not present

## 2022-12-17 DIAGNOSIS — Z1389 Encounter for screening for other disorder: Secondary | ICD-10-CM | POA: Diagnosis not present

## 2022-12-17 DIAGNOSIS — M797 Fibromyalgia: Secondary | ICD-10-CM | POA: Diagnosis not present

## 2022-12-17 DIAGNOSIS — M5030 Other cervical disc degeneration, unspecified cervical region: Secondary | ICD-10-CM | POA: Diagnosis not present

## 2022-12-17 DIAGNOSIS — G894 Chronic pain syndrome: Secondary | ICD-10-CM | POA: Diagnosis not present

## 2022-12-17 DIAGNOSIS — M5136 Other intervertebral disc degeneration, lumbar region: Secondary | ICD-10-CM | POA: Diagnosis not present

## 2022-12-17 DIAGNOSIS — M5416 Radiculopathy, lumbar region: Secondary | ICD-10-CM | POA: Diagnosis not present

## 2022-12-18 ENCOUNTER — Telehealth: Payer: Self-pay

## 2022-12-18 NOTE — Telephone Encounter (Signed)
Paitent notified of results and copy sent to PCP.

## 2022-12-18 NOTE — Telephone Encounter (Signed)
-----   Message from [name], MD sent at 12/18/2022  9:16 AM EST ----- Regarding: call Tell her labs all normal except mild elevation of the bilirubin, not sure why. CT looks good, incl pancreas. She does have fatty liver  Send copies of labs and scan to her PCP, Dr. Jeryl Columbia, to see if rec GI consult

## 2022-12-18 NOTE — Telephone Encounter (Signed)
Attempted to contact patient. No answer and no VM.  

## 2022-12-24 HISTORY — PX: FUNCTIONAL ENDOSCOPIC SINUS SURGERY: SUR616

## 2022-12-25 DIAGNOSIS — E063 Autoimmune thyroiditis: Secondary | ICD-10-CM | POA: Diagnosis not present

## 2022-12-25 DIAGNOSIS — Z6831 Body mass index (BMI) 31.0-31.9, adult: Secondary | ICD-10-CM | POA: Diagnosis not present

## 2022-12-25 DIAGNOSIS — C50919 Malignant neoplasm of unspecified site of unspecified female breast: Secondary | ICD-10-CM | POA: Diagnosis not present

## 2022-12-25 DIAGNOSIS — Z23 Encounter for immunization: Secondary | ICD-10-CM | POA: Diagnosis not present

## 2022-12-25 DIAGNOSIS — E669 Obesity, unspecified: Secondary | ICD-10-CM | POA: Diagnosis not present

## 2022-12-25 DIAGNOSIS — E038 Other specified hypothyroidism: Secondary | ICD-10-CM | POA: Diagnosis not present

## 2022-12-26 ENCOUNTER — Other Ambulatory Visit: Payer: Self-pay | Admitting: Internal Medicine

## 2022-12-28 DIAGNOSIS — J342 Deviated nasal septum: Secondary | ICD-10-CM | POA: Diagnosis not present

## 2022-12-28 DIAGNOSIS — J323 Chronic sphenoidal sinusitis: Secondary | ICD-10-CM | POA: Diagnosis not present

## 2022-12-28 DIAGNOSIS — Z23 Encounter for immunization: Secondary | ICD-10-CM | POA: Diagnosis not present

## 2022-12-28 DIAGNOSIS — J343 Hypertrophy of nasal turbinates: Secondary | ICD-10-CM | POA: Diagnosis not present

## 2022-12-28 DIAGNOSIS — R0981 Nasal congestion: Secondary | ICD-10-CM | POA: Diagnosis not present

## 2023-01-13 DIAGNOSIS — J343 Hypertrophy of nasal turbinates: Secondary | ICD-10-CM | POA: Diagnosis not present

## 2023-01-13 DIAGNOSIS — Z7985 Long-term (current) use of injectable non-insulin antidiabetic drugs: Secondary | ICD-10-CM | POA: Diagnosis not present

## 2023-01-13 DIAGNOSIS — J342 Deviated nasal septum: Secondary | ICD-10-CM | POA: Diagnosis not present

## 2023-01-13 DIAGNOSIS — J324 Chronic pansinusitis: Secondary | ICD-10-CM | POA: Diagnosis not present

## 2023-01-13 DIAGNOSIS — R0981 Nasal congestion: Secondary | ICD-10-CM | POA: Diagnosis not present

## 2023-01-14 DIAGNOSIS — E785 Hyperlipidemia, unspecified: Secondary | ICD-10-CM | POA: Diagnosis not present

## 2023-01-14 DIAGNOSIS — Z9889 Other specified postprocedural states: Secondary | ICD-10-CM | POA: Diagnosis not present

## 2023-01-14 DIAGNOSIS — E038 Other specified hypothyroidism: Secondary | ICD-10-CM | POA: Diagnosis not present

## 2023-01-14 DIAGNOSIS — K219 Gastro-esophageal reflux disease without esophagitis: Secondary | ICD-10-CM | POA: Diagnosis not present

## 2023-01-22 DIAGNOSIS — J329 Chronic sinusitis, unspecified: Secondary | ICD-10-CM | POA: Diagnosis not present

## 2023-01-29 DIAGNOSIS — J329 Chronic sinusitis, unspecified: Secondary | ICD-10-CM | POA: Diagnosis not present

## 2023-02-05 DIAGNOSIS — J329 Chronic sinusitis, unspecified: Secondary | ICD-10-CM | POA: Diagnosis not present

## 2023-02-08 DIAGNOSIS — M5030 Other cervical disc degeneration, unspecified cervical region: Secondary | ICD-10-CM | POA: Diagnosis not present

## 2023-02-08 DIAGNOSIS — M5416 Radiculopathy, lumbar region: Secondary | ICD-10-CM | POA: Diagnosis not present

## 2023-02-08 DIAGNOSIS — G894 Chronic pain syndrome: Secondary | ICD-10-CM | POA: Diagnosis not present

## 2023-02-08 DIAGNOSIS — M5136 Other intervertebral disc degeneration, lumbar region: Secondary | ICD-10-CM | POA: Diagnosis not present

## 2023-02-08 DIAGNOSIS — M47816 Spondylosis without myelopathy or radiculopathy, lumbar region: Secondary | ICD-10-CM | POA: Diagnosis not present

## 2023-02-08 DIAGNOSIS — M797 Fibromyalgia: Secondary | ICD-10-CM | POA: Diagnosis not present

## 2023-02-08 DIAGNOSIS — M549 Dorsalgia, unspecified: Secondary | ICD-10-CM | POA: Diagnosis not present

## 2023-02-08 DIAGNOSIS — Z1389 Encounter for screening for other disorder: Secondary | ICD-10-CM | POA: Diagnosis not present

## 2023-02-14 DIAGNOSIS — E039 Hypothyroidism, unspecified: Secondary | ICD-10-CM | POA: Diagnosis not present

## 2023-02-14 DIAGNOSIS — E785 Hyperlipidemia, unspecified: Secondary | ICD-10-CM | POA: Diagnosis not present

## 2023-02-14 DIAGNOSIS — K219 Gastro-esophageal reflux disease without esophagitis: Secondary | ICD-10-CM | POA: Diagnosis not present

## 2023-02-22 ENCOUNTER — Ambulatory Visit: Payer: Medicare HMO | Admitting: Podiatry

## 2023-02-22 DIAGNOSIS — L600 Ingrowing nail: Secondary | ICD-10-CM

## 2023-02-22 DIAGNOSIS — L603 Nail dystrophy: Secondary | ICD-10-CM

## 2023-02-22 NOTE — Patient Instructions (Signed)

## 2023-02-22 NOTE — Progress Notes (Signed)
Subjective:  Patient ID: Laura Hill, female    DOB: 06-Feb-1961,  MRN: [id number]  Chief Complaint  Patient presents with   Foot Problem    left foot great toenail thcik and disfigured and 2nd toe as well/ would like great toenail removed/ hx of chemo caused nails to be disfigured    62 y.o. female presents with concern for left great toenail pain thickness and disfiguration.  She says the nail previously came off when she had chemotherapy a number of years ago.  It never grow back correctly.  It does not hurt her significantly however it does bother her in regards to its appearance.  She would like it removed.  She also has some pain and irritation of the left second nail which is growing abnormally she thinks also related to chemo.  Past Medical History:  Diagnosis Date   Breast cancer ([facility])    DDD (degenerative disc disease), cervical    Fibromyalgia    Hashimoto's disease    Hypercholesteremia    Irritable bowel syndrome (IBS)    Mass of soft tissue of chest 09/03/2022   Migraine, cervicogenic    Osteoporosis    Thyroid disease     Allergies  Allergen Reactions   Bactrim [Sulfamethoxazole-Trimethoprim] Hives    Facial swelling with hives     ROS: Negative except as per HPI above  Objective:  General: AAO x3, NAD  Dermatological: Incurvation and dystrophic growth is present along the total nail of the left great and second toe. There is localized edema without any erythema or increase in warmth around the nail border. There is no drainage or pus. There is no ascending cellulitis. No malodor. No open lesions or pre-ulcerative lesions.    Vascular:  Dorsalis Pedis artery and Posterior Tibial artery pedal pulses are 2/4 bilateral.  Capillary fill time < 3 sec to all digits.   Neruologic: Grossly diminished via light touch to the digits  Musculoskeletal: No gross boney pedal deformities bilateral. No pain, crepitus, or limitation noted with foot and ankle range of  motion bilateral. Muscular strength 5/5 in all groups tested bilateral.  Gait: Unassisted, Nonantalgic.   No images are attached to the encounter.   Assessment:   1. Ingrown nail of great toe of left foot   2. Ingrown nail of second toe of left foot   3. Nail dystrophy      Plan:  Patient was evaluated and treated and all questions answered.    Ingrown Nail, left hallux total nail and left second toe total nail -Patient elects to proceed with minor surgery to remove ingrown and dystrophic toenail today. Consent reviewed and signed by patient. -Ingrown nail excised. See procedure note. -Educated on post-procedure care including soaking. Written instructions provided and reviewed. -Patient to follow up in 2 weeks for nail check.  Procedure: Excision of Ingrown Toenail left hallux total and left second toe total nail removal with phenol and alcohol matrixectomy Location: Left 1st and 2nd total nail Anesthesia: Lidocaine 1% plain; 1.5 mL and Marcaine 0.5% plain; 1.5 mL, digital block. Skin Prep: Betadine. Dressing: Silvadene; telfa; dry, sterile, compression dressing. Technique: Following skin prep, the toe was exsanguinated and a tourniquet was secured at the base of the toe. The affected nail border was freed, split with a nail splitter, and excised. Chemical matrixectomy was then performed with phenol and irrigated out with alcohol. The tourniquet was then removed and sterile dressing applied. Disposition: Patient tolerated procedure well. Patient to return in 2  weeks for follow-up.    Return in about 2 weeks (around 03/08/2023) for Follow-up left hallux and second toe total nail phenol and alcohol matrixectomy.          Corinna GabAlex F Chantrell Apsey, DPM [facility] / [id number]

## 2023-03-05 DIAGNOSIS — L905 Scar conditions and fibrosis of skin: Secondary | ICD-10-CM | POA: Diagnosis not present

## 2023-03-05 DIAGNOSIS — L570 Actinic keratosis: Secondary | ICD-10-CM | POA: Diagnosis not present

## 2023-03-05 DIAGNOSIS — L819 Disorder of pigmentation, unspecified: Secondary | ICD-10-CM | POA: Diagnosis not present

## 2023-03-05 DIAGNOSIS — D485 Neoplasm of uncertain behavior of skin: Secondary | ICD-10-CM | POA: Diagnosis not present

## 2023-03-05 DIAGNOSIS — L820 Inflamed seborrheic keratosis: Secondary | ICD-10-CM | POA: Diagnosis not present

## 2023-03-05 DIAGNOSIS — D224 Melanocytic nevi of scalp and neck: Secondary | ICD-10-CM | POA: Diagnosis not present

## 2023-03-05 DIAGNOSIS — D2362 Other benign neoplasm of skin of left upper limb, including shoulder: Secondary | ICD-10-CM | POA: Diagnosis not present

## 2023-03-05 DIAGNOSIS — L821 Other seborrheic keratosis: Secondary | ICD-10-CM | POA: Diagnosis not present

## 2023-03-05 DIAGNOSIS — D171 Benign lipomatous neoplasm of skin and subcutaneous tissue of trunk: Secondary | ICD-10-CM | POA: Diagnosis not present

## 2023-03-08 ENCOUNTER — Ambulatory Visit (INDEPENDENT_AMBULATORY_CARE_PROVIDER_SITE_OTHER): Payer: Medicare HMO | Admitting: Podiatry

## 2023-03-08 DIAGNOSIS — L600 Ingrowing nail: Secondary | ICD-10-CM | POA: Diagnosis not present

## 2023-03-08 DIAGNOSIS — L603 Nail dystrophy: Secondary | ICD-10-CM | POA: Diagnosis not present

## 2023-03-08 NOTE — Progress Notes (Signed)
Subjective: Laura Hill is a 62 y.o.  female returns to office today for follow up evaluation after having left Hallux and 2nd toe nail total nail ingrown removal with phenol and alcohol matrixectomy approximately 2 weeks ago. Patient has been soaking using epsom salts and applying topical antibiotic covered with bandaid daily. Patient denies fevers, chills, nausea, vomiting. Denies any calf pain, chest pain, SOB.   Objective:  Vitals: Reviewed  General: Well developed, nourished, in no acute distress, alert and oriented x3   Dermatology: Skin is warm, dry and supple bilateral. Left hallux and 2nd toenail nail bed appears to be clean, dry, with mild granular tissue and surrounding scab. There is no surrounding erythema, edema, drainage/purulence. The remaining nails appear unremarkable at this time. There are no other lesions or other signs of infection present.  Neurovascular status: Intact. No lower extremity swelling; No pain with calf compression bilateral.  Musculoskeletal: Decreased tenderness to palpation of the left hallux and 2nd nail fold(s). Muscular strength within normal limits bilateral.   Assesement and Plan: S/p phenol and alcohol matrixectomy to the  left hallux nail and 2nd toenail total, doing well.   -Continue soaking in epsom salts twice a day followed by antibiotic ointment and a band-aid. Can leave uncovered at night. Continue this until completely healed.  -If the area has not healed in 2 weeks, call the office for follow-up appointment, or sooner if any problems arise.  -Monitor for any signs/symptoms of infection. Call the office immediately if any occur or go directly to the emergency room. Call with any questions/concerns.        Corinna Gab, DPM [facility] / [facility]                   03/08/2023

## 2023-03-25 DIAGNOSIS — T3695XA Adverse effect of unspecified systemic antibiotic, initial encounter: Secondary | ICD-10-CM | POA: Diagnosis not present

## 2023-03-25 DIAGNOSIS — J029 Acute pharyngitis, unspecified: Secondary | ICD-10-CM | POA: Diagnosis not present

## 2023-03-25 DIAGNOSIS — Z20818 Contact with and (suspected) exposure to other bacterial communicable diseases: Secondary | ICD-10-CM | POA: Diagnosis not present

## 2023-03-25 DIAGNOSIS — B379 Candidiasis, unspecified: Secondary | ICD-10-CM | POA: Diagnosis not present

## 2023-03-25 DIAGNOSIS — R509 Fever, unspecified: Secondary | ICD-10-CM | POA: Diagnosis not present

## 2023-04-05 DIAGNOSIS — M5416 Radiculopathy, lumbar region: Secondary | ICD-10-CM | POA: Diagnosis not present

## 2023-04-05 DIAGNOSIS — M797 Fibromyalgia: Secondary | ICD-10-CM | POA: Diagnosis not present

## 2023-04-05 DIAGNOSIS — G894 Chronic pain syndrome: Secondary | ICD-10-CM | POA: Diagnosis not present

## 2023-04-05 DIAGNOSIS — M47816 Spondylosis without myelopathy or radiculopathy, lumbar region: Secondary | ICD-10-CM | POA: Diagnosis not present

## 2023-04-05 DIAGNOSIS — M549 Dorsalgia, unspecified: Secondary | ICD-10-CM | POA: Diagnosis not present

## 2023-04-05 DIAGNOSIS — M5136 Other intervertebral disc degeneration, lumbar region: Secondary | ICD-10-CM | POA: Diagnosis not present

## 2023-04-05 DIAGNOSIS — Z1389 Encounter for screening for other disorder: Secondary | ICD-10-CM | POA: Diagnosis not present

## 2023-04-05 DIAGNOSIS — M542 Cervicalgia: Secondary | ICD-10-CM | POA: Diagnosis not present

## 2023-04-05 DIAGNOSIS — M5030 Other cervical disc degeneration, unspecified cervical region: Secondary | ICD-10-CM | POA: Diagnosis not present

## 2023-04-16 DIAGNOSIS — Z6831 Body mass index (BMI) 31.0-31.9, adult: Secondary | ICD-10-CM | POA: Diagnosis not present

## 2023-04-16 DIAGNOSIS — Z0000 Encounter for general adult medical examination without abnormal findings: Secondary | ICD-10-CM | POA: Diagnosis not present

## 2023-04-16 DIAGNOSIS — E785 Hyperlipidemia, unspecified: Secondary | ICD-10-CM | POA: Diagnosis not present

## 2023-04-16 DIAGNOSIS — E038 Other specified hypothyroidism: Secondary | ICD-10-CM | POA: Diagnosis not present

## 2023-04-16 DIAGNOSIS — N3001 Acute cystitis with hematuria: Secondary | ICD-10-CM | POA: Diagnosis not present

## 2023-04-16 DIAGNOSIS — Z780 Asymptomatic menopausal state: Secondary | ICD-10-CM | POA: Diagnosis not present

## 2023-04-16 DIAGNOSIS — R7303 Prediabetes: Secondary | ICD-10-CM | POA: Diagnosis not present

## 2023-04-16 DIAGNOSIS — C50919 Malignant neoplasm of unspecified site of unspecified female breast: Secondary | ICD-10-CM | POA: Diagnosis not present

## 2023-04-16 DIAGNOSIS — G90A Postural orthostatic tachycardia syndrome (POTS): Secondary | ICD-10-CM | POA: Diagnosis not present

## 2023-04-16 DIAGNOSIS — E063 Autoimmune thyroiditis: Secondary | ICD-10-CM | POA: Diagnosis not present

## 2023-04-18 DIAGNOSIS — J028 Acute pharyngitis due to other specified organisms: Secondary | ICD-10-CM | POA: Diagnosis not present

## 2023-04-18 DIAGNOSIS — J029 Acute pharyngitis, unspecified: Secondary | ICD-10-CM | POA: Diagnosis not present

## 2023-04-21 ENCOUNTER — Encounter: Payer: Self-pay | Admitting: Cardiology

## 2023-04-21 DIAGNOSIS — R0602 Shortness of breath: Secondary | ICD-10-CM | POA: Diagnosis not present

## 2023-04-21 DIAGNOSIS — G4733 Obstructive sleep apnea (adult) (pediatric): Secondary | ICD-10-CM | POA: Diagnosis not present

## 2023-04-23 DIAGNOSIS — Z8673 Personal history of transient ischemic attack (TIA), and cerebral infarction without residual deficits: Secondary | ICD-10-CM | POA: Diagnosis not present

## 2023-04-23 DIAGNOSIS — R42 Dizziness and giddiness: Secondary | ICD-10-CM | POA: Diagnosis not present

## 2023-04-23 DIAGNOSIS — J329 Chronic sinusitis, unspecified: Secondary | ICD-10-CM | POA: Diagnosis not present

## 2023-04-23 DIAGNOSIS — R55 Syncope and collapse: Secondary | ICD-10-CM | POA: Diagnosis not present

## 2023-04-23 DIAGNOSIS — I6381 Other cerebral infarction due to occlusion or stenosis of small artery: Secondary | ICD-10-CM | POA: Diagnosis not present

## 2023-04-24 DIAGNOSIS — R0602 Shortness of breath: Secondary | ICD-10-CM | POA: Diagnosis not present

## 2023-04-24 DIAGNOSIS — G4733 Obstructive sleep apnea (adult) (pediatric): Secondary | ICD-10-CM | POA: Diagnosis not present

## 2023-05-08 DIAGNOSIS — R300 Dysuria: Secondary | ICD-10-CM | POA: Diagnosis not present

## 2023-05-08 DIAGNOSIS — R350 Frequency of micturition: Secondary | ICD-10-CM | POA: Diagnosis not present

## 2023-05-08 DIAGNOSIS — R3915 Urgency of urination: Secondary | ICD-10-CM | POA: Diagnosis not present

## 2023-05-08 DIAGNOSIS — N390 Urinary tract infection, site not specified: Secondary | ICD-10-CM | POA: Diagnosis not present

## 2023-05-17 DIAGNOSIS — M542 Cervicalgia: Secondary | ICD-10-CM | POA: Diagnosis not present

## 2023-05-18 DIAGNOSIS — R770 Abnormality of albumin: Secondary | ICD-10-CM | POA: Diagnosis not present

## 2023-05-25 DIAGNOSIS — J329 Chronic sinusitis, unspecified: Secondary | ICD-10-CM | POA: Diagnosis not present

## 2023-05-26 DIAGNOSIS — M542 Cervicalgia: Secondary | ICD-10-CM | POA: Diagnosis not present

## 2023-05-27 ENCOUNTER — Telehealth: Payer: Self-pay | Admitting: Gastroenterology

## 2023-05-27 DIAGNOSIS — G4730 Sleep apnea, unspecified: Secondary | ICD-10-CM

## 2023-05-27 HISTORY — DX: Sleep apnea, unspecified: G47.30

## 2023-05-27 NOTE — Telephone Encounter (Signed)
PT is having what she feels are pancreatic issues. She has been experiencing central back pain, brown to dark yellow urine, clay colored stools, itchy scalp when sleepy, fatigue, RT shoulder pain, abd pain and blisters on the lips and chest that leave brown spots. Her brother died of pancreatic cancer at 71. She wants to discuss with nurse to see if she should schedule appointment. Please call her at [phone]

## 2023-05-28 DIAGNOSIS — K838 Other specified diseases of biliary tract: Secondary | ICD-10-CM | POA: Diagnosis not present

## 2023-05-28 DIAGNOSIS — R1013 Epigastric pain: Secondary | ICD-10-CM | POA: Diagnosis not present

## 2023-05-28 DIAGNOSIS — Z6833 Body mass index (BMI) 33.0-33.9, adult: Secondary | ICD-10-CM | POA: Diagnosis not present

## 2023-05-28 DIAGNOSIS — C50919 Malignant neoplasm of unspecified site of unspecified female breast: Secondary | ICD-10-CM | POA: Diagnosis not present

## 2023-05-28 DIAGNOSIS — E038 Other specified hypothyroidism: Secondary | ICD-10-CM | POA: Diagnosis not present

## 2023-05-28 DIAGNOSIS — E063 Autoimmune thyroiditis: Secondary | ICD-10-CM | POA: Diagnosis not present

## 2023-05-28 DIAGNOSIS — N3000 Acute cystitis without hematuria: Secondary | ICD-10-CM | POA: Diagnosis not present

## 2023-05-28 NOTE — Telephone Encounter (Signed)
Patient called in with the following symptoms that started about 6 months ago: Intermittent, mid abdominal pain (sharp, 10/10) that radiates to her right shoulder and central back pain (8/10); occasional clay colored stools, dark yellow/foamy urine, fatigue, blisters on lips/chest, and itchy scalp (worse at night). States PCP ordered labs & [place] that showed hyperbilirubinemia and enlarged bile duct. Family history of pancreatic cancer (brother passed away at 81). PCP referred her to [facility], but they do not evaluate the pancreas and PCP felt that she needed to be seen with our office instead. Patient has been scheduled for OV with Dr. Chales Abrahams on 06/28/23 at 8:50 am. She was last seen here for endo on 12/23/21. Advised that she contact our office if symptoms worsen or if she has other concerns. Fax has been sent to Dr. Waverly Ferrari office at [facility] requesting labs and [place] results for Dr. Chales Abrahams to review.

## 2023-05-28 NOTE — Telephone Encounter (Signed)
Left message for patient to call back  

## 2023-06-02 NOTE — Telephone Encounter (Signed)
Lets get labs from Dr. Waverly Ferrari office [place] 05/18/2023 from [facility] reviewed: 7 mm CBD.  Normal gallbladder and liver.  Negative Doppler  Can we please get MRI pancreas with MRCP prior to OV. RE: abdominal pain, abn [place], FH pancreatic CA  RG

## 2023-06-03 ENCOUNTER — Other Ambulatory Visit: Payer: Self-pay

## 2023-06-03 NOTE — Telephone Encounter (Signed)
Left message for patient to call back  

## 2023-06-04 ENCOUNTER — Other Ambulatory Visit: Payer: Medicare HMO

## 2023-06-04 ENCOUNTER — Ambulatory Visit: Payer: Medicare HMO | Admitting: Oncology

## 2023-06-04 ENCOUNTER — Other Ambulatory Visit: Payer: Self-pay

## 2023-06-04 DIAGNOSIS — R109 Unspecified abdominal pain: Secondary | ICD-10-CM

## 2023-06-04 DIAGNOSIS — Z800 Family history of malignant neoplasm of digestive organs: Secondary | ICD-10-CM

## 2023-06-04 DIAGNOSIS — R9389 Abnormal findings on diagnostic imaging of other specified body structures: Secondary | ICD-10-CM

## 2023-06-04 NOTE — Telephone Encounter (Signed)
Patient returned your call. Requesting a call back before 2:30 due to having to get on a plane. Please advise.

## 2023-06-04 NOTE — Telephone Encounter (Signed)
Left message for patient to call back  

## 2023-06-04 NOTE — Telephone Encounter (Signed)
Requested labs be faxed to [phone].

## 2023-06-04 NOTE — Telephone Encounter (Signed)
Spoke with patient regarding MD recommendations & confirmed she would like MRI at [facility] location. She's been advised that schedulers will notify her within 1 week for an appointment, and if not to give [place] a call back and let us know. Order placed.  Also, reached out to Dr. Waverly Ferrari nurse Wilkie Aye, and left detailed voicemail for her to send over labs & [place] note before patient's appointment.

## 2023-06-07 ENCOUNTER — Ambulatory Visit: Payer: Medicare HMO | Attending: Cardiology

## 2023-06-07 ENCOUNTER — Encounter: Payer: Self-pay | Admitting: Cardiology

## 2023-06-07 ENCOUNTER — Ambulatory Visit: Payer: Medicare HMO | Attending: Cardiology | Admitting: Cardiology

## 2023-06-07 VITALS — BP 102/70 | HR 63 | Ht 67.0 in | Wt 224.0 lb

## 2023-06-07 DIAGNOSIS — E785 Hyperlipidemia, unspecified: Secondary | ICD-10-CM

## 2023-06-07 DIAGNOSIS — I6930 Unspecified sequelae of cerebral infarction: Secondary | ICD-10-CM

## 2023-06-07 DIAGNOSIS — C773 Secondary and unspecified malignant neoplasm of axilla and upper limb lymph nodes: Secondary | ICD-10-CM

## 2023-06-07 DIAGNOSIS — G4730 Sleep apnea, unspecified: Secondary | ICD-10-CM

## 2023-06-07 DIAGNOSIS — C50912 Malignant neoplasm of unspecified site of left female breast: Secondary | ICD-10-CM | POA: Diagnosis not present

## 2023-06-07 DIAGNOSIS — R000 Tachycardia, unspecified: Secondary | ICD-10-CM

## 2023-06-07 DIAGNOSIS — R55 Syncope and collapse: Secondary | ICD-10-CM

## 2023-06-07 HISTORY — DX: Unspecified sequelae of cerebral infarction: I69.30

## 2023-06-07 HISTORY — DX: Hyperlipidemia, unspecified: E78.5

## 2023-06-07 HISTORY — DX: Syncope and collapse: R55

## 2023-06-07 NOTE — Telephone Encounter (Signed)
Labs and [place] were received:  Copies made. Copy placed on Dr. Chales Abrahams desk and copy sent to be scanned into Epic.

## 2023-06-07 NOTE — Patient Instructions (Signed)
Medication Instructions:  Your physician recommends that you continue on your current medications as directed. Please refer to the Current Medication list given to you today.  *If you need a refill on your cardiac medications before your next appointment, please call your pharmacy*   Lab Work: None ordered If you have labs (blood work) drawn today and your tests are completely normal, you will receive your results only by: MyChart Message (if you have MyChart) OR A paper copy in the mail If you have any lab test that is abnormal or we need to change your treatment, we will call you to review the results.   Testing/Procedures: Your physician has requested that you have an echocardiogram. Echocardiography is a painless test that uses sound waves to create images of your heart. It provides your doctor with information about the size and shape of your heart and how well your heart's chambers and valves are working. This procedure takes approximately one hour. There are no restrictions for this procedure. Please do NOT wear cologne, perfume, aftershave, or lotions (deodorant is allowed). Please arrive 15 minutes prior to your appointment time.  A zio monitor was ordered today. It will remain on for 14 days. Remove 06/21/23. You will then return monitor and event diary in provided box. It takes 1-2 weeks for report to be downloaded and returned to [place]. We will call you with the results. If monitor falls off or has orange flashing light, please call Zio for further instructions.   Your physician has requested that you have a carotid duplex. This test is an ultrasound of the carotid arteries in your neck. It looks at blood flow through these arteries that supply the brain with blood. Allow one hour for this exam. There are no restrictions or special instructions.   Follow-Up: At [facility], you and your health needs are our priority.  As part of our continuing mission to provide you with exceptional  heart care, we have created designated Provider Care Teams.  These Care Teams include your primary Cardiologist (physician) and Advanced Practice Providers (APPs -  Physician Assistants and Nurse Practitioners) who all work together to provide you with the care you need, when you need it.  We recommend signing up for the patient portal called "MyChart".  Sign up information is provided on this After Visit Summary.  MyChart is used to connect with patients for Virtual Visits (Telemedicine).  Patients are able to view lab/test results, encounter notes, upcoming appointments, etc.  Non-urgent messages can be sent to your provider as well.   To learn more about what you can do with MyChart, go to [email or web address].    Your next appointment:   2 month(s)  The format for your next appointment:   In Person  Provider:   Gypsy Balsam, MD   Other Instructions Echocardiogram An echocardiogram is a test that uses sound waves (ultrasound) to produce images of the heart. Images from an echocardiogram can provide important information about: Heart size and shape. The size and thickness and movement of your heart's walls. Heart muscle function and strength. Heart valve function or if you have stenosis. Stenosis is when the heart valves are too narrow. If blood is flowing backward through the heart valves (regurgitation). A tumor or infectious growth around the heart valves. Areas of heart muscle that are not working well because of poor blood flow or injury from a heart attack. Aneurysm detection. An aneurysm is a weak or damaged part of an artery wall. The  wall bulges out from the normal force of blood pumping through the body. Tell a health care provider about: Any allergies you have. All medicines you are taking, including vitamins, herbs, eye drops, creams, and over-the-counter medicines. Any blood disorders you have. Any surgeries you have had. Any medical conditions you have. Whether  you are pregnant or may be pregnant. What are the risks? Generally, this is a safe test. However, problems may occur, including an allergic reaction to dye (contrast) that may be used during the test. What happens before the test? No specific preparation is needed. You may eat and drink normally. What happens during the test? You will take off your clothes from the waist up and put on a hospital gown. Electrodes or electrocardiogram (ECG)patches may be placed on your chest. The electrodes or patches are then connected to a device that monitors your heart rate and rhythm. You will lie down on a table for an ultrasound exam. A gel will be applied to your chest to help sound waves pass through your skin. A handheld device, called a transducer, will be pressed against your chest and moved over your heart. The transducer produces sound waves that travel to your heart and bounce back (or "echo" back) to the transducer. These sound waves will be captured in real-time and changed into images of your heart that can be viewed on a video monitor. The images will be recorded on a computer and reviewed by your health care provider. You may be asked to change positions or hold your breath for a short time. This makes it easier to get different views or better views of your heart. In some cases, you may receive contrast through an IV in one of your veins. This can improve the quality of the pictures from your heart. The procedure may vary among health care providers and hospitals.   What can I expect after the test? You may return to your normal, everyday life, including diet, activities, and medicines, unless your health care provider tells you not to do that. Follow these instructions at home: It is up to you to get the results of your test. Ask your health care provider, or the department that is doing the test, when your results will be ready. Keep all follow-up visits. This is important. Summary An  echocardiogram is a test that uses sound waves (ultrasound) to produce images of the heart. Images from an echocardiogram can provide important information about the size and shape of your heart, heart muscle function, heart valve function, and other possible heart problems. You do not need to do anything to prepare before this test. You may eat and drink normally. After the echocardiogram is completed, you may return to your normal, everyday life, unless your health care provider tells you not to do that. This information is not intended to replace advice given to you by your health care provider. Make sure you discuss any questions you have with your health care provider. Document Revised: 06/25/2020 Document Reviewed: 06/25/2020 Elsevier Patient Education  2021 Elsevier Inc.   Important Information About Sugar

## 2023-06-07 NOTE — Addendum Note (Signed)
Addended by: Eleonore Chiquito on: 06/07/2023 09:27 AM   Modules accepted: Orders

## 2023-06-07 NOTE — Progress Notes (Signed)
Cardiology Consultation:    Date:  06/07/2023   ID:  [name], DOB Dec 12, 1960, MRN [id number]  PCP:  Buckner Malta, MD  Cardiologist:  Gypsy Balsam, MD   Referring MD: Buckner Malta, MD   Chief Complaint  Patient presents with   Loss of Consciousness   Tachycardia   h/o stroke    History of Present Illness:    Laura Hill is a 62 y.o. female who is being seen today for the evaluation of syncope at the request of Buckner Malta, MD. past medical history significant for breast cancer she had double mastectomy and lymph node removal bilaterally chemotherapy however there was more than 10 years ago seems to be in remission, dyslipidemia recently started on statin, she never smoked, she was referred to [place] because of episode of syncope.  She woke up in the middle of the night because her dog named foxy wanted to go outside so she got up was a little bit dizzy already went to the porch and then when she was standing waiting for the dog she passed out.  She did not injure herself she is not sure exactly how long she was out there was no seizing activity there is no tongue biting.  After that she got CT of her head done by primary care physician which showed old infarct in the right cerebellar hemisphere.  Obviously there is a surprising finding.  She does have dyslipidemia successfully effectively treated with statin, she used to exercise on the regular basis but lately does not do it with no particular reason for not doing it, never smoked, she does not have family history of premature coronary artery disease all her family members in the past and cancer.  She is not on any special diet.  She does have any chest pain tightness squeezing pressure burning chest there is no shortness of breath.  Past Medical History:  Diagnosis Date   B12 deficiency    Breast cancer (HCC) 2009   Double Masectomy and lymph node removal bilaterally   Chronic pain disorder    DDD (degenerative  disc disease), cervical    Fibromyalgia    GERD (gastroesophageal reflux disease)    Hashimoto's disease    Hypercholesteremia    Hyperlipidemia    Irritable bowel syndrome (IBS)    Mass of soft tissue of chest 09/03/2022   Migraine, cervicogenic    Osteoporosis    Thyroid disease    Vitamin D deficiency     Past Surgical History:  Procedure Laterality Date   ABDOMINAL HYSTERECTOMY  2009   BREAST RECONSTRUCTION Bilateral 2010   DILATATION AND CURETTAGE/HYSTEROSCOPY WITH MINERVA     FUNCTIONAL ENDOSCOPIC SINUS SURGERY  12/24/2022   Septoplasty Submucosal Resectioning of Inferior Turbinates   LEG SURGERY     MASTECTOMY Bilateral 2009   REPLACEMENT TOTAL KNEE Bilateral    SALPINGOOPHORECTOMY Bilateral     Current Medications: Current Meds  Medication Sig   Cyanocobalamin (VITAMIN B12 PO) Take 1 tablet by mouth daily. Strength unknown   dicyclomine (BENTYL) 20 MG tablet Take 20 mg by mouth as directed.   DULoxetine (CYMBALTA) 60 MG capsule Take 60 mg by mouth daily.   HYDROcodone-acetaminophen (NORCO/VICODIN) 5-325 MG tablet Take 1 tablet by mouth daily as needed for moderate pain or severe pain.   levothyroxine (SYNTHROID) 88 MCG tablet Take 1 tablet (88 mcg total) by mouth daily before breakfast.   omeprazole (PRILOSEC) 40 MG capsule Take 40 mg by mouth daily.  pregabalin (LYRICA) 100 MG capsule Take 100 mg by mouth daily.   rosuvastatin (CRESTOR) 20 MG tablet Take 20 mg by mouth daily.   scopolamine (TRANSDERM-SCOP) 1 MG/3DAYS Place 1 patch onto the skin every 3 (three) days.   Semaglutide, 1 MG/DOSE, (OZEMPIC, 1 MG/DOSE,) 4 MG/3ML SOPN Inject 1 mg into the skin once a week.   tamsulosin (FLOMAX) 0.4 MG CAPS capsule Take 0.4 mg by mouth at bedtime.   tiZANidine (ZANAFLEX) 4 MG tablet Take 4 mg by mouth daily.   VITAMIN D PO Take 1 tablet by mouth daily. Strength unknown     Allergies:   Bactrim [sulfamethoxazole-trimethoprim]   Social History   Socioeconomic History    Marital status: Married    Spouse name: Maisie Fus   Number of children: 4   Years of education: 12+   Highest education level: 12th grade  Occupational History   Not on file  Tobacco Use   Smoking status: Never   Smokeless tobacco: Never  Vaping Use   Vaping status: Never Used  Substance and Sexual Activity   Alcohol use: Not Currently    Comment: rare   Drug use: Never   Sexual activity: Not Currently  Other Topics Concern   Not on file  Social History Narrative   Not on file   Social Determinants of Health   Financial Resource Strain: Not on file  Food Insecurity: Not on file  Transportation Needs: Not on file  Physical Activity: Not on file  Stress: Not on file  Social Connections: Not on file     Family History: The patient's family history includes Breast cancer (age of onset: 32) in her sister; Breast cancer (age of onset: 61) in her paternal aunt; Breast cancer (age of onset: 10) in her paternal grandmother; Breast cancer (age of onset: 35) in her brother; Cancer (age of onset: 62) in her brother; Colon cancer (age of onset: 44) in her mother; Colon cancer (age of onset: 73) in her maternal uncle; Melanoma (age of onset: 32) in her cousin; Multiple myeloma (age of onset: 42) in her mother; Prostate cancer (age of onset: 49) in her father; Skin cancer in her maternal aunt; Stomach cancer (age of onset: 42) in her maternal uncle. ROS:   Please see the history of present illness.    All 14 point review of systems negative except as described per history of present illness.  EKGs/Labs/Other Studies Reviewed:    The following studies were reviewed today: CT of her head showed no acute intracranial process, old infarct in the right cerebellar hemisphere, to acute on chronic sinus diseas  EKG:  EKG Interpretation Date/Time:  Monday June 07 2023 08:43:20 EDT Ventricular Rate:  63 PR Interval:  180 QRS Duration:  90 QT Interval:  412 QTC Calculation: 421 R  Axis:   76  Text Interpretation: Normal sinus rhythm Normal ECG No previous ECGs available Confirmed by Gypsy Balsam [id number]) on 06/07/2023 8:57:24 AM    Recent Labs: 12/04/2022: ALT 15; BUN 15; Creatinine 0.94; Hemoglobin 14.1; Platelet Count 243; Potassium 3.5; Sodium 138  Recent Lipid Panel No results found for: "CHOL", "TRIG", "HDL", "CHOLHDL", "VLDL", "LDLCALC", "LDLDIRECT"  Physical Exam:    VS:  BP 102/70 (BP Location: Left Arm, Patient Position: Sitting)   Pulse 63   Ht 5\' 7"  (1.702 m)   Wt 224 lb (101.6 kg)   SpO2 96%   BMI 35.08 kg/m     Wt Readings from Last 3 Encounters:  06/07/23 224  lb (101.6 kg)  04/16/23 207 lb (93.9 kg)  12/04/22 209 lb 1.6 oz (94.8 kg)     GEN:  Well nourished, well developed in no acute distress HEENT: Normal NECK: No JVD; No carotid bruits LYMPHATICS: No lymphadenopathy CARDIAC: RRR, no murmurs, no rubs, no gallops RESPIRATORY:  Clear to auscultation without rales, wheezing or rhonchi  ABDOMEN: Soft, non-tender, non-distended MUSCULOSKELETAL:  No edema; No deformity  SKIN: Warm and dry NEUROLOGIC:  Alert and oriented x 3 PSYCHIATRIC:  Normal affect   ASSESSMENT:    1. Tachycardia   2. Syncope and collapse   3. 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 Breast cancer 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 lymph node, left (HCC)   5. Sleep apnea treated with continuous positive airway pressure (CPAP)   6. Dyslipidemia    PLAN:    In order of problems listed above:  Syncope and collapse.  She did have some prodromal symptoms before suggesting possibility of dehydration that was in the middle of the night may be some vagal component, however the symptoms only very mild.  We obviously must rule out arrhythmia.  I will ask her to wear Zio patch for 2 weeks to see if she get any significant arrhythmia.  A supportive avolition echocardiogram will be done to assess structurally heart. Late  effect of CVA.  Incidental finding.  She is already on antiplatelet therapy as well as statin which is an excellent choice.  She will be scheduled to have carotic ultrasounds make sure she does not have any EXTR her cranial carotic arterial disease.  Given the localization of the stroke in the cerebellum this is less suggesting of it.  In the meantime we will continue present management. Dyslipidemia I did review K PN and her age total cholesterol is 111 HDL 48.  She is taking Crestor 20 which I will continue. Sleep apnea recently started treatment not convinced that it helps her.  She actually had a rough night last night tolerating the mask.  I think she is still in the process of adjusting it   Medication Adjustments/Labs and Tests Ordered: Current medicines are reviewed at length with the patient today.  Concerns regarding medicines are outlined above.  Orders Placed This Encounter  Procedures   EKG 12-Lead   No orders of the defined types were placed in this encounter.   Signed, Georgeanna Lea, MD, [facility]. 06/07/2023 9:16 AM    [facility]

## 2023-06-08 ENCOUNTER — Inpatient Hospital Stay (INDEPENDENT_AMBULATORY_CARE_PROVIDER_SITE_OTHER): Payer: Medicare HMO | Admitting: Oncology

## 2023-06-08 ENCOUNTER — Encounter: Payer: Self-pay | Admitting: Oncology

## 2023-06-08 ENCOUNTER — Inpatient Hospital Stay: Payer: Medicare HMO | Attending: Oncology

## 2023-06-08 VITALS — BP 125/72 | HR 73 | Temp 98.0°F | Resp 16 | Ht 67.0 in | Wt 221.1 lb

## 2023-06-08 DIAGNOSIS — Z9221 Personal history of antineoplastic chemotherapy: Secondary | ICD-10-CM | POA: Diagnosis not present

## 2023-06-08 DIAGNOSIS — Z1501 Genetic susceptibility to malignant neoplasm of breast: Secondary | ICD-10-CM | POA: Insufficient documentation

## 2023-06-08 DIAGNOSIS — Z800 Family history of malignant neoplasm of digestive organs: Secondary | ICD-10-CM | POA: Diagnosis not present

## 2023-06-08 DIAGNOSIS — Z853 Personal history of malignant neoplasm of breast: Secondary | ICD-10-CM

## 2023-06-08 DIAGNOSIS — C50011 Malignant neoplasm of nipple and areola, right female breast: Secondary | ICD-10-CM

## 2023-06-08 DIAGNOSIS — Z1502 Genetic susceptibility to malignant neoplasm of ovary: Secondary | ICD-10-CM | POA: Diagnosis not present

## 2023-06-08 DIAGNOSIS — Z1509 Genetic susceptibility to other malignant neoplasm: Secondary | ICD-10-CM

## 2023-06-08 DIAGNOSIS — Z8673 Personal history of transient ischemic attack (TIA), and cerebral infarction without residual deficits: Secondary | ICD-10-CM

## 2023-06-08 DIAGNOSIS — Z90722 Acquired absence of ovaries, bilateral: Secondary | ICD-10-CM | POA: Diagnosis not present

## 2023-06-08 DIAGNOSIS — R1010 Upper abdominal pain, unspecified: Secondary | ICD-10-CM

## 2023-06-08 DIAGNOSIS — Z08 Encounter for follow-up examination after completed treatment for malignant neoplasm: Secondary | ICD-10-CM | POA: Diagnosis not present

## 2023-06-08 DIAGNOSIS — Z9013 Acquired absence of bilateral breasts and nipples: Secondary | ICD-10-CM

## 2023-06-08 DIAGNOSIS — Z90710 Acquired absence of both cervix and uterus: Secondary | ICD-10-CM

## 2023-06-08 LAB — CMP (CANCER CENTER ONLY)
ALT: 15 U/L (ref 0–44)
AST: 17 U/L (ref 15–41)
Albumin: 3.7 g/dL (ref 3.5–5.0)
Alkaline Phosphatase: 80 U/L (ref 38–126)
Anion gap: 7 (ref 5–15)
BUN: 13 mg/dL (ref 8–23)
CO2: 25 mmol/L (ref 22–32)
Calcium: 8.8 mg/dL — ABNORMAL LOW (ref 8.9–10.3)
Chloride: 105 mmol/L (ref 98–111)
Creatinine: 0.83 mg/dL (ref 0.44–1.00)
GFR, Estimated: >60 mL/min (ref >60)
Glucose, Bld: 118 mg/dL — ABNORMAL HIGH (ref 70–99)
Potassium: 3.6 mmol/L (ref 3.5–5.1)
Sodium: 137 mmol/L (ref 135–145)
Total Bilirubin: 1.2 mg/dL (ref 0.3–1.2)
Total Protein: 6.6 g/dL (ref 6.5–8.1)

## 2023-06-08 LAB — CBC WITH DIFFERENTIAL (CANCER CENTER ONLY)
Abs Immature Granulocytes: 0.02 10*3/uL (ref 0.00–0.07)
Basophils Absolute: 0.1 10*3/uL (ref 0.0–0.1)
Basophils Relative: 1 %
Eosinophils Absolute: 0.5 10*3/uL (ref 0.0–0.5)
Eosinophils Relative: 7 %
HCT: 40.5 % (ref 36.0–46.0)
Hemoglobin: 12.8 g/dL (ref 12.0–15.0)
Immature Granulocytes: 0 %
Lymphocytes Relative: 27 %
Lymphs Abs: 1.8 10*3/uL (ref 0.7–4.0)
MCH: 27.5 pg (ref 26.0–34.0)
MCHC: 31.6 g/dL (ref 30.0–36.0)
MCV: 87.1 fL (ref 80.0–100.0)
Monocytes Absolute: 0.3 10*3/uL (ref 0.1–1.0)
Monocytes Relative: 5 %
Neutro Abs: 4.1 10*3/uL (ref 1.7–7.7)
Neutrophils Relative %: 60 %
Platelet Count: 279 10*3/uL (ref 150–400)
RBC: 4.65 MIL/uL (ref 3.87–5.11)
RDW: 15 % (ref 11.5–15.5)
WBC Count: 6.7 10*3/uL (ref 4.0–10.5)
nRBC: 0 % (ref 0.0–0.2)

## 2023-06-08 NOTE — Progress Notes (Signed)
[facility]  [address] [place],  [place]  [address] [phone]  Clinic Day:06/08/23   Referring physician: Buckner Malta, MD  ASSESSMENT & PLAN:   History of bilateral stage IIA breast cancers, (T2 N0 M0) on the right and (T1 N1 M0) on the left, diagnosed in 2009. 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 She received dose dense AC followed by weekly Taxol as well as endocrine therapy for a total of 10 years. She has undergone bilateral mastectomies with reconstruction. She remains without evidence of  recurrence.  BRCA2 mutation. Therefore, she has undergone bilateral mastectomies as well as hysterectomy and bilateral salpingo oophorectomy.  History of colon cancer and pancreatic cancer in the family. She did have a colonoscopy and EGD last year and the colon polyp of the transverse colon was hyperplastic. Dr. Chales Abrahams has recommended repeat in 3-5 years. She is concerned because previously she has had adenomatous polyps.  Upper abdominal pain, intermittent for the last 6 months. I just feel a non specific firmness in the left epigastric area. However, with her family history of colon and pancreatic cancer as well as BRCA2 mutation, I scheduled a CT scan of abdomen and pelvis in January 2024. This revealed pancreatic atrophy but no evidence of malignancy and no explanation for her symptoms.   Plan She has had multiple falls and 1 episode of syncope. She has a CT of her brain which revealed a previous stroke and she was placed on Aspirin 81mg  daily and also she wears a heart monitor now. She lost her brother to pancreatic cancer a few moths ago. I scheduled a CT scan of abdomen and pelvis in January 2024. This revealed pancreatic atrophy but no evidence of malignancy and no explanation for her symptoms. She was referred to a gastroenterologist and has an abdominal MRI scheduled for 06/25/2023.  Her labs are pending today. She  has blood work done by her PCP so I will see her  back in 6 months for examination. She understands and agrees with this plan of care. I have answered her questions and she knows to call with any concerns.  I provided 20 minutes of face-to-face time during this this encounter and > 50% was spent counseling as documented under my assessment and plan.    [name], MD [facility] [address] [place] [address] Dept: [phone] Dept Fax: [phone]    CHIEF COMPLAINT:  CC: History of bilateral stage IIA breast cancers; BRCA2 positive  Current Treatment:  Surveillance   HISTORY OF PRESENT ILLNESS:  Laura Hill is a 62 y.o. female who presents as a transfer of care for the continued evaluation and management due to her history of bilateral stage IIA breast cancers, (T2 N0 M0) on the right and (T1 N1 M0) on the left. 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 This was originally diagnosed in 2009. She did receive dose dense AC followed by 12 weeks of weekly Taxol. Due to her BRCA2 positivity, she underwent bilateral mastectomies as well as hysterectomy and bilateral salpingo oophorectomy. She also received endocrine therapy with tamoxifen for 3 years followed by 7 years of letrozole. She did receive Boneva for osteopenia while on letrozole, and most recent bone density from May 2022 was normal. She did undergo bilateral breast reconstructions in 2010.  Lab evaluation from December 2022 revealed a normal CBC and CMP  was unremarkable. 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 There is a tiny hypodense lesion of the left lobe of the liver, which is too small to accurately  characterize, but statistically likely to reflect cyst. I reviewed the images with the patient and have difficulty even seeing this lesion. MRI pelvis from December 23rd revealed sigmoid diverticulosis, without evidence of active diverticulitis. No definite evidence of colovaginal fistula. She lost her brother to pancreatic cancer in 2024. I scheduled a CT scan of abdomen and pelvis in January 2024. This revealed pancreatic atrophy but no evidence of malignancy.   PAST MEDICAL HISTORY  I have reviewed her chart and materials related to her cancer extensively and collaborated history with the patient. Summary of oncologic history is as follows: Oncology History  Bilateral breast cancer (HCC)  11/22/2007 Initial Diagnosis   Breast cancer in female [facility])   07/25/2008 Cancer Staging   Staging form: Breast, AJCC 6th Edition - Clinical stage from 07/25/2008: Stage IIA (T2, N0, M0) - Signed by [name], MD on 12/15/2021 Staged by: Staging assigned at another facility Diagnostic confirmation: Positive histology Specimen type: Excision Laterality: Right Stage prefix: Initial diagnosis   Breast cancer 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 lymph node, left (HCC)  11/22/2007 Initial Diagnosis   Breast cancer 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 lymph node, left (HCC)   07/25/2008 Cancer Staging   Staging form: Breast, AJCC 6th Edition - Clinical stage from 07/25/2008: Stage IIA (T1, N1, M0) - Signed by [name], MD on 12/15/2021 Staged by: Staging assigned at another facility Diagnostic confirmation: Positive histology Specimen type: Excision Laterality: Left Total positive nodes: 2 Stage prefix: Initial diagnosis Staging comments: Adjuvant DD AC followed by Taxol, tamoxifen x 3 years, Letrozole x 7 yrs   See summary above for Oncology history    INTERVAL HISTORY:  [name] is here today for a follow up for her history of bilateral stage IIA breast cancers; BRCA2 positive. She also has esophageal stenosis and  has required dilatation in the past for dysphagia. She does have Hashimoto's disease and is on observation only. She does have fibromyalgia and degenerative disc disease. She has borderline sleep apnea.  She states that she feels well and has no complaint of pain. She has had multiple falls and 1 episode of syncope. She has a CT of her brain which revealed a previous stroke and she was placed on Aspirin 81mg  daily and also she wears a heart monitor now. She lost her brother to pancreatic cancer a few moths ago. I scheduled a CT scan of abdomen and pelvis in January 2024. This revealed pancreatic atrophy but no evidence of malignancy and no explanation for her symptoms. She was referred to a gastroenterologist and has an abdominal MRI scheduled for 06/25/2023.  Her labs are pending today. She has blood work done by her PCP so I will see her  back in 6 months for examination.  She  denies signs of infections such as sore throat, sinus drainage, cough or urinary symptoms. She  denies fever or recurrent chills. She  also deny nausea, vomiting, chest pain, or dyspnea. Her  appetite is good and Her  weight has increased 14 pounds over last 2 months    HISTORY:   Past Medical History:  Diagnosis Date   B12 deficiency  Breast cancer (HCC) 2009   Double Masectomy and lymph node removal bilaterally   Chronic pain disorder    DDD (degenerative disc disease), cervical    Fibromyalgia    GERD (gastroesophageal reflux disease)    Hashimoto's disease    Hypercholesteremia    Hyperlipidemia    Irritable bowel syndrome (IBS)    Mass of soft tissue of chest 09/03/2022   Migraine, cervicogenic    Osteoporosis    Thyroid disease    Vitamin D deficiency     Past Surgical History:  Procedure Laterality Date   ABDOMINAL HYSTERECTOMY  2009   BREAST RECONSTRUCTION Bilateral 2010   DILATATION AND CURETTAGE/HYSTEROSCOPY WITH MINERVA     FUNCTIONAL ENDOSCOPIC SINUS SURGERY  12/24/2022   Septoplasty Submucosal  Resectioning of Inferior Turbinates   LEG SURGERY     MASTECTOMY Bilateral 2009   REPLACEMENT TOTAL KNEE Bilateral    SALPINGOOPHORECTOMY Bilateral     Family History  Problem Relation Age of Onset   Colon cancer Mother 80   Multiple myeloma Mother 51   Prostate cancer Father 27   Breast cancer Sister 76   Cancer Brother 52       oral   Breast cancer Brother 62   Breast cancer Paternal Grandmother 73   Skin cancer Maternal Aunt        70s   Colon cancer Maternal Uncle 76   Stomach cancer Maternal Uncle 64   Breast cancer Paternal Aunt 70   Melanoma Cousin 10       maternal    Social History:  reports that she has never smoked. She has never used smokeless tobacco. She reports that she does not currently use alcohol. She reports that she does not use drugs.The patient is alone today. She is married and lives at home with her spouse. She has 2 biological children and has adopted two of her grandchildren. She works, and has never been exposed to chemicals or other toxic agents.  Allergies:  Allergies  Allergen Reactions   Bactrim [Sulfamethoxazole-Trimethoprim] Hives    Facial swelling with hives     Current Medications: Current Outpatient Medications  Medication Sig Dispense Refill   aspirin EC 81 MG tablet Take 81 mg by mouth daily. Swallow whole.     Cyanocobalamin (VITAMIN B12 PO) Take 1 tablet by mouth daily. Strength unknown     dicyclomine (BENTYL) 20 MG tablet Take 20 mg by mouth as directed.     DULoxetine (CYMBALTA) 60 MG capsule Take 60 mg by mouth daily.     HYDROcodone-acetaminophen (NORCO/VICODIN) 5-325 MG tablet Take 1 tablet by mouth daily as needed for moderate pain or severe pain.     levothyroxine (SYNTHROID) 88 MCG tablet Take 1 tablet (88 mcg total) by mouth daily before breakfast. 90 tablet 0   omeprazole (PRILOSEC) 40 MG capsule Take 40 mg by mouth daily.     pregabalin (LYRICA) 100 MG capsule Take 100 mg by mouth daily.     rosuvastatin (CRESTOR) 20  MG tablet Take 20 mg by mouth daily.     scopolamine (TRANSDERM-SCOP) 1 MG/3DAYS Place 1 patch onto the skin every 3 (three) days.     Semaglutide, 1 MG/DOSE, (OZEMPIC, 1 MG/DOSE,) 4 MG/3ML SOPN Inject 1 mg into the skin once a week.     tamsulosin (FLOMAX) 0.4 MG CAPS capsule Take 0.4 mg by mouth at bedtime.     tiZANidine (ZANAFLEX) 4 MG tablet Take 4 mg by mouth daily.  VITAMIN D PO Take 1 tablet by mouth daily. Strength unknown     No current facility-administered medications for this visit.    REVIEW OF SYSTEMS:  Review of Systems  Constitutional: Negative.  Negative for appetite change, chills, diaphoresis, fatigue, fever and unexpected weight change.  HENT:  Negative.  Negative for hearing loss, lump/mass, mouth sores, nosebleeds, sore throat, tinnitus, trouble swallowing and voice change.   Eyes: Negative.  Negative for eye problems and icterus.  Respiratory: Negative.  Negative for chest tightness, cough, hemoptysis, shortness of breath and wheezing.   Cardiovascular: Negative.  Negative for chest pain, leg swelling and palpitations.  Gastrointestinal:  Negative for abdominal distention, abdominal pain, blood in stool, constipation, diarrhea, nausea, rectal pain and vomiting.  Endocrine: Negative.   Genitourinary: Negative.  Negative for bladder incontinence, difficulty urinating, dyspareunia, dysuria, frequency, hematuria, menstrual problem, nocturia, pelvic pain, vaginal bleeding and vaginal discharge.        Dark yellow urine  Musculoskeletal: Negative.  Negative for arthralgias, back pain, flank pain, gait problem, myalgias, neck pain and neck stiffness.  Skin: Negative.  Negative for itching, rash and wound.  Neurological: Negative.  Negative for dizziness, extremity weakness, gait problem, headaches, light-headedness, numbness, seizures and speech difficulty.       Syncope  Hematological: Negative.  Negative for adenopathy. Does not bruise/bleed easily.   Psychiatric/Behavioral: Negative.  Negative for confusion, decreased concentration, depression, sleep disturbance and suicidal ideas. The patient is not nervous/anxious.       VITALS:  Blood pressure 125/72, pulse 73, temperature 98 F (36.7 C), temperature source Oral, resp. rate 16, height 5\' 7"  (1.702 m), weight 221 lb 1.6 oz (100.3 kg), SpO2 100%.  Wt Readings from Last 3 Encounters:  06/08/23 221 lb 1.6 oz (100.3 kg)  06/07/23 224 lb (101.6 kg)  04/16/23 207 lb (93.9 kg)    Body mass index is 34.63 kg/m.  Performance status (ECOG): 1 - Symptomatic but completely ambulatory  PHYSICAL EXAM:  Physical Exam Vitals and nursing note reviewed.  Constitutional:      General: She is not in acute distress.    Appearance: Normal appearance. She is normal weight. She is not ill-appearing, toxic-appearing or diaphoretic.  HENT:     Head: Normocephalic and atraumatic.     Right Ear: Tympanic membrane, ear canal and external ear normal. There is no impacted cerumen.     Left Ear: Tympanic membrane, ear canal and external ear normal. There is no impacted cerumen.     Nose: Nose normal. No congestion or rhinorrhea.     Mouth/Throat:     Mouth: Mucous membranes are moist.     Pharynx: Oropharynx is clear. No oropharyngeal exudate or posterior oropharyngeal erythema.  Eyes:     General: No scleral icterus.       Right eye: No discharge.        Left eye: No discharge.     Extraocular Movements: Extraocular movements intact.     Conjunctiva/sclera: Conjunctivae normal.     Pupils: Pupils are equal, round, and reactive to light.  Neck:     Vascular: No carotid bruit.  Cardiovascular:     Rate and Rhythm: Normal rate and regular rhythm.     Pulses: Normal pulses.     Heart sounds: Normal heart sounds. No murmur heard.    No friction rub. No gallop.  Pulmonary:     Effort: Pulmonary effort is normal. No respiratory distress.     Breath sounds: No stridor. No wheezing,  rhonchi or rales.   Chest:     Chest wall: No tenderness.     Comments: Bilateral reconstructions are negative. She has a scar across the mid chest.  Abdominal:     General: Bowel sounds are normal. There is no distension.     Palpations: Abdomen is soft. There is no hepatomegaly, splenomegaly or mass.     Tenderness: There is no abdominal tenderness. There is no right CVA tenderness, left CVA tenderness, guarding or rebound.     Hernia: No hernia is present.  Musculoskeletal:        General: No swelling, tenderness, deformity or signs of injury. Normal range of motion.     Cervical back: Normal range of motion and neck supple. No rigidity or tenderness.     Right lower leg: No edema.     Left lower leg: No edema.  Lymphadenopathy:     Cervical: No cervical adenopathy.  Skin:    General: Skin is warm and dry.     Coloration: Skin is not jaundiced or pale.     Findings: No bruising, erythema, lesion or rash.  Neurological:     General: No focal deficit present.     Mental Status: She is alert and oriented to person, place, and time. Mental status is at baseline.     Cranial Nerves: No cranial nerve deficit.     Sensory: No sensory deficit.     Motor: No weakness.     Coordination: Coordination normal.     Gait: Gait normal.     Deep Tendon Reflexes: Reflexes normal.  Psychiatric:        Mood and Affect: Mood normal.        Behavior: Behavior normal.        Thought Content: Thought content normal.        Judgment: Judgment normal.      LABS:      Latest Ref Rng & Units 06/08/2023    3:30 PM 12/04/2022    4:03 PM  CBC  WBC 4.0 - 10.5 K/uL 6.7  6.4   Hemoglobin 12.0 - 15.0 g/dL 84.1  66.0   Hematocrit 36.0 - 46.0 % 40.5  43.4   Platelets 150 - 400 K/uL 279  243       Latest Ref Rng & Units 06/08/2023    3:30 PM 12/04/2022    4:03 PM  CMP  Glucose 70 - 99 mg/dL 630  81   BUN 8 - 23 mg/dL 13  15   Creatinine 1.60 - 1.00 mg/dL 1.09  3.23   Sodium 557 - 145 mmol/L 137  138   Potassium 3.5  - 5.1 mmol/L 3.6  3.5   Chloride 98 - 111 mmol/L 105  108   CO2 22 - 32 mmol/L 25  24   Calcium 8.9 - 10.3 mg/dL 8.8  8.4   Total Protein 6.5 - 8.1 g/dL 6.6  6.7   Total Bilirubin 0.3 - 1.2 mg/dL 1.2  1.6   Alkaline Phos 38 - 126 U/L 80  67   AST 15 - 41 U/L 17  19   ALT 0 - 44 U/L 15  15    Component Ref Range & Units 8 mo ago  Free T4 0.60 - 1.60 ng/dL 3.22   Component Ref Range & Units 8 mo ago  TSH 0.35 - 5.50 uIU/mL 0.01 Low      Lab Results  Component Value Date   CEA1 2.7 12/05/2021   /  CEA  Date Value Ref Range Status  12/05/2021 2.7 0.0 - 4.7 ng/mL Final    Comment:    (NOTE)                             Nonsmokers          <3.9                             Smokers             <5.6 [employer] Electrochemiluminescence Immunoassay (ECLIA) Values obtained with different assay methods or kits cannot be used interchangeably.  Results cannot be interpreted as absolute evidence of the presence or absence of malignant disease. Performed At: [facility] [address] [place], [place] [id number] Jolene Schimke MD YQ:[id number]    No results found for: "PSA1" No results found for: "CAN199" Lab Results  Component Value Date   CAN125 5.1 12/05/2021    No results found for: "TOTALPROTELP", "ALBUMINELP", "A1GS", "A2GS", "BETS", "BETA2SER", "GAMS", "MSPIKE", "SPEI" No results found for: "TIBC", "FERRITIN", "IRONPCTSAT" No results found for: "LDH"  STUDIES:          I,Oluwatobi Asade,acting as a scribe for [name], MD.,have documented all relevant documentation on the behalf of [name], MD,as directed by  [name], MD while in the presence of [name], MD.

## 2023-06-09 ENCOUNTER — Telehealth: Payer: Self-pay | Admitting: Oncology

## 2023-06-09 ENCOUNTER — Other Ambulatory Visit: Payer: Self-pay

## 2023-06-09 NOTE — Telephone Encounter (Signed)
Ultrasound 05/18/2023 -CBD 7 mm -Gallbladder normal.  No cholelithiasis  Normal CBC, CMP including LFTs.  Normal lipase  OK to proceed with MRI as already scheduled Follow-up thereafter  RG

## 2023-06-09 NOTE — Telephone Encounter (Signed)
Contacted pt to schedule an appt. Unable to reach via phone, voicemail was left.   Scheduling Message Entered by Gery Pray H on 06/08/2023 at  4:20 PM Priority: Routine <No visit type provided>  Department: [facility]  Provider:  Scheduling Notes:  RT 6 months

## 2023-06-09 NOTE — Telephone Encounter (Signed)
Pt made aware of recent results and Dr. Gupta recommendations: Pt verbalized understanding with all questions answered.   

## 2023-06-09 NOTE — Telephone Encounter (Signed)
Left message for pt to call back  °

## 2023-06-22 DIAGNOSIS — R55 Syncope and collapse: Secondary | ICD-10-CM | POA: Diagnosis not present

## 2023-06-25 ENCOUNTER — Other Ambulatory Visit: Payer: Self-pay | Admitting: Gastroenterology

## 2023-06-25 ENCOUNTER — Ambulatory Visit (HOSPITAL_COMMUNITY)
Admission: RE | Admit: 2023-06-25 | Discharge: 2023-06-25 | Disposition: A | Discharge status: Home / Self Care | Payer: Medicare HMO | Source: Ambulatory Visit | Attending: Gastroenterology | Admitting: Gastroenterology

## 2023-06-25 DIAGNOSIS — R9389 Abnormal findings on diagnostic imaging of other specified body structures: Secondary | ICD-10-CM

## 2023-06-25 DIAGNOSIS — Z800 Family history of malignant neoplasm of digestive organs: Secondary | ICD-10-CM

## 2023-06-25 DIAGNOSIS — R109 Unspecified abdominal pain: Secondary | ICD-10-CM | POA: Diagnosis not present

## 2023-06-25 DIAGNOSIS — K838 Other specified diseases of biliary tract: Secondary | ICD-10-CM | POA: Diagnosis not present

## 2023-06-25 MED ORDER — GADOBUTROL 1 MMOL/ML IV SOLN
10.0000 mL | Freq: Once | INTRAVENOUS | Status: AC | PRN
  Administered 2023-06-25: 10 mL via INTRAVENOUS

## 2023-06-28 ENCOUNTER — Ambulatory Visit (INDEPENDENT_AMBULATORY_CARE_PROVIDER_SITE_OTHER): Payer: Medicare HMO | Admitting: Gastroenterology

## 2023-06-28 ENCOUNTER — Encounter: Payer: Self-pay | Admitting: Gastroenterology

## 2023-06-28 ENCOUNTER — Other Ambulatory Visit (INDEPENDENT_AMBULATORY_CARE_PROVIDER_SITE_OTHER): Payer: Medicare HMO

## 2023-06-28 VITALS — BP 106/60 | HR 76 | Ht 67.0 in | Wt 219.0 lb

## 2023-06-28 DIAGNOSIS — Z8719 Personal history of other diseases of the digestive system: Secondary | ICD-10-CM

## 2023-06-28 DIAGNOSIS — Z86010 Personal history of colonic polyps: Secondary | ICD-10-CM | POA: Diagnosis not present

## 2023-06-28 DIAGNOSIS — R1013 Epigastric pain: Secondary | ICD-10-CM | POA: Diagnosis not present

## 2023-06-28 DIAGNOSIS — R1031 Right lower quadrant pain: Secondary | ICD-10-CM

## 2023-06-28 DIAGNOSIS — R634 Abnormal weight loss: Secondary | ICD-10-CM

## 2023-06-28 DIAGNOSIS — R1310 Dysphagia, unspecified: Secondary | ICD-10-CM

## 2023-06-28 DIAGNOSIS — R1032 Left lower quadrant pain: Secondary | ICD-10-CM

## 2023-06-28 DIAGNOSIS — Z800 Family history of malignant neoplasm of digestive organs: Secondary | ICD-10-CM | POA: Diagnosis not present

## 2023-06-28 DIAGNOSIS — K219 Gastro-esophageal reflux disease without esophagitis: Secondary | ICD-10-CM

## 2023-06-28 DIAGNOSIS — K582 Mixed irritable bowel syndrome: Secondary | ICD-10-CM

## 2023-06-28 LAB — COMPREHENSIVE METABOLIC PANEL
ALT: 11 U/L (ref 0–35)
AST: 13 U/L (ref 0–37)
Albumin: 4 g/dL (ref 3.5–5.2)
Alkaline Phosphatase: 74 U/L (ref 39–117)
BUN: 13 mg/dL (ref 6–23)
CO2: 29 mEq/L (ref 19–32)
Calcium: 9 mg/dL (ref 8.4–10.5)
Chloride: 103 mEq/L (ref 96–112)
Creatinine, Ser: 0.8 mg/dL (ref 0.40–1.20)
GFR: 79.23 mL/min (ref >60.00)
Glucose, Bld: 75 mg/dL (ref 70–99)
Potassium: 3.8 mEq/L (ref 3.5–5.1)
Sodium: 138 mEq/L (ref 135–145)
Total Bilirubin: 1.2 mg/dL (ref 0.2–1.2)
Total Protein: 6.3 g/dL (ref 6.0–8.3)

## 2023-06-28 LAB — CBC WITH DIFFERENTIAL/PLATELET
Basophils Absolute: 0.1 10*3/uL (ref 0.0–0.1)
Basophils Relative: 1.1 % (ref 0.0–3.0)
Eosinophils Absolute: 0.4 10*3/uL (ref 0.0–0.7)
Eosinophils Relative: 7.8 % — ABNORMAL HIGH (ref 0.0–5.0)
HCT: 41.7 % (ref 36.0–46.0)
Hemoglobin: 13.3 g/dL (ref 12.0–15.0)
Lymphocytes Relative: 31.3 % (ref 12.0–46.0)
Lymphs Abs: 1.8 10*3/uL (ref 0.7–4.0)
MCHC: 32 g/dL (ref 30.0–36.0)
MCV: 85.2 fl (ref 78.0–100.0)
Monocytes Absolute: 0.4 10*3/uL (ref 0.1–1.0)
Monocytes Relative: 6.4 % (ref 3.0–12.0)
Neutro Abs: 3 10*3/uL (ref 1.4–7.7)
Neutrophils Relative %: 53.4 % (ref 43.0–77.0)
Platelets: 265 10*3/uL (ref 150.0–400.0)
RBC: 4.89 Mil/uL (ref 3.87–5.11)
RDW: 15.1 % (ref 11.5–15.5)
WBC: 5.7 10*3/uL (ref 4.0–10.5)

## 2023-06-28 NOTE — Progress Notes (Signed)
Chief Complaint: FU  Referring Provider:  Buckner Malta, MD      ASSESSMENT AND PLAN;   #1. Epi pain with neg MRCP/US 06/2023  #2. GERD with H/O HH with occ dysphagia. Neg prev Bx for EoE/Barrett's.  #3. IBS with alternating diarrhea and constipation.  #4 FH colon Ca (mom at age 62). H/O colonic polyps. FH- pancreatic Ca (brother age 75). Neg MRCP 06/2023    Plan:  - CBC, CMP today - HIDA with EF to r/o biliary dyskinesia - Rpt colon due 03/2027.   HPI:    Laura FREEH is a 62 y.o. female  With history of breast cancer (BRCA 2+, s/p B/L mastectomy, hysterectomy), fibromyalgia, GERD, IC, IBS, obesity, HLD, hypothyroidism, chronic headaches, OSA,   FU Clay coloured stools x 6 months with associated epi pain and ?jaundice. She told me that her bilirubin was elevated.  On review bilirubin 1.6, then 1.2 with normal AST/ALT s/o Gilbert's syndrome Ultrasound was negative Since she had family history of pancreatic CA (brother at age 72), had MRCP which was neg Clay colored stools and epigastric pain has more or less resolved.  Concerned about biliary dyskinesia  Denies having any fever chills or night sweats.  C/O IBS Dx 2009, alt diarrhea and constipation with bloating.  When she gets constipated she will take Linzess 290 mcg.she rarely uses Imodium.  Has associated lower abdominal pain which gets better with defecation.  Recently had wt loss 20lb over 1 year.  Occ rectal bleeding d/t hoids.  Better with Preparation H.  Longstanding history of gastroesophageal reflux, better with omeprazole.  No further dysphagia after dilation  Family history of colon cancer -Mom at age 81, ampularry Ca at 38, MM  Braca2 +.  Lynch was negative.  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  MRI did not show any evidence of fistula.  She is not having frequent UTIs.  No history of pneumaturia.  She also had fatty liver on CT Abdo/pelvis.  Normal LFTs.  No  alcohol.  Wt Readings from Last 3 Encounters:  06/28/23 219 lb (99.3 kg)  06/08/23 221 lb 1.6 oz (100.3 kg)  06/07/23 224 lb (101.6 kg)     Past GI work-up   RUQ [place] 05/2023: neg  MRCP 06/25/2023 1. No acute abnormality in the abdomen. 2. No biliary ductal dilation.  Colonoscopy 12/2021 - One 6 mm polyp in the proximal transverse colon, removed with a cold snare. Resected and retrieved. - Diverticulosis in the sigmoid colon. - Non-bleeding internal hemorrhoids\- Bx: SSA - Rpt 5 yrs   EGD 12/2021 - Minimal hiatal hernia. - Mild gastritis. - Gastric polyps. - S/P esophageal dilatation  CT AP with contrast 10/22/2021 1. Extensive sigmoid colonic diverticulosis with a portion of sigmoid colon intimately associated with the left vaginal cuff, with gas and trace hyperdense material in the vagina. 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 Consider more definitive characterization with CT abdomen and pelvis with IV and per rectal contrast material. No evidence of acute diverticulitis. 2. Diffuse hepatic steatosis. 3.  Aortic Atherosclerosis (ICD10-I70.0).  MRI: Pelvis 11/07/2021 IMPRESSION: Sigmoid diverticulosis, without evidence of active diverticulitis.   No definite evidence of colovaginal fistula. If there is persistent clinical suspicion, consider pelvis CT with rectal contrast administration due to its higher sensitivity.     Past GI work-up (Dr Barbee Sh[facility] at [facility]): Colonoscopy 11/27/2018 ([facility]): Mild diverticulosis, grade 1 internal hemorrhoids, strong family history of colon cancer.  Repeat in  3 years. Colonoscopy 07/13/2017 grade 2 internal hemorrhoids s/p banding, mild diverticulosis, neg colon biopsies. Colonoscopy 09/03/2015 mild diverticulosis, internal hemorrhoids. Colonoscopy 02/2013 8 mm cecal polyp tubulovillous adenoma, ascending colon 6 mm polyp tubular adenoma. EGD 07/13/2017: Mild gastritis, fundic gland polyps.  Dilation empiric 54Fr  (better) EGD 08/24/2015: Empiric 54 Fr, no Barrett's or EOE.  No obvious hiatal hernia. EGD 02/22/2013 LA grade a EE, 54Fr Past Medical History:  Diagnosis Date   B12 deficiency    Breast cancer (HCC) 2009   Double Masectomy and lymph node removal bilaterally   Chronic pain disorder    DDD (degenerative disc disease), cervical    Fibromyalgia    GERD (gastroesophageal reflux disease)    Hashimoto's disease    Hypercholesteremia    Hyperlipidemia    Irritable bowel syndrome (IBS)    Mass of soft tissue of chest 09/03/2022   Migraine, cervicogenic    Osteoporosis    Thyroid disease    Vitamin D deficiency     Past Surgical History:  Procedure Laterality Date   ABDOMINAL HYSTERECTOMY  2009   BREAST RECONSTRUCTION Bilateral 2010   DILATATION AND CURETTAGE/HYSTEROSCOPY WITH MINERVA     FUNCTIONAL ENDOSCOPIC SINUS SURGERY  12/24/2022   Septoplasty Submucosal Resectioning of Inferior Turbinates   LEG SURGERY     MASTECTOMY Bilateral 2009   REPLACEMENT TOTAL KNEE Bilateral    SALPINGOOPHORECTOMY Bilateral     Family History  Problem Relation Age of Onset   Colon cancer Mother 65   Multiple myeloma Mother 75   Prostate cancer Father 50   Breast cancer Sister 18   Cancer Brother 11       oral   Breast cancer Brother 64   Breast cancer Paternal Grandmother 10   Skin cancer Maternal Aunt        70s   Colon cancer Maternal Uncle 76   Stomach cancer Maternal Uncle 30   Breast cancer Paternal Aunt 68   Melanoma Cousin 101       maternal    Social History   Tobacco Use   Smoking status: Never   Smokeless tobacco: Never  Vaping Use   Vaping status: Never Used  Substance Use Topics   Alcohol use: Not Currently    Comment: rare   Drug use: Never    Current Outpatient Medications  Medication Sig Dispense Refill   aspirin EC 81 MG tablet Take 81 mg by mouth daily. Swallow whole.     Cyanocobalamin (VITAMIN B12 PO) Take 1 tablet by mouth daily. Strength unknown      dicyclomine (BENTYL) 20 MG tablet Take 20 mg by mouth as directed.     DULoxetine (CYMBALTA) 60 MG capsule Take 60 mg by mouth daily.     HYDROcodone-acetaminophen (NORCO/VICODIN) 5-325 MG tablet Take 1 tablet by mouth daily as needed for moderate pain or severe pain.     levothyroxine (SYNTHROID) 88 MCG tablet Take 1 tablet (88 mcg total) by mouth daily before breakfast. 90 tablet 0   omeprazole (PRILOSEC) 40 MG capsule Take 40 mg by mouth daily.     pregabalin (LYRICA) 100 MG capsule Take 100 mg by mouth daily.     rosuvastatin (CRESTOR) 40 MG tablet Take 40 mg by mouth daily.     Semaglutide, 1 MG/DOSE, (OZEMPIC, 1 MG/DOSE,) 4 MG/3ML SOPN Inject 1 mg into the skin once a week.     tamsulosin (FLOMAX) 0.4 MG CAPS capsule Take 0.4 mg by mouth at bedtime.  tiZANidine (ZANAFLEX) 4 MG tablet Take 4 mg by mouth daily.     VITAMIN D PO Take 1 tablet by mouth daily. Strength unknown     rosuvastatin (CRESTOR) 20 MG tablet Take 20 mg by mouth daily.     scopolamine (TRANSDERM-SCOP) 1 MG/3DAYS Place 1 patch onto the skin every 3 (three) days.     No current facility-administered medications for this visit.    Allergies  Allergen Reactions   Bactrim [Sulfamethoxazole-Trimethoprim] Hives    Facial swelling with hives    ROS: Psychiatric/Behavioral: has anxiety or depression     Physical Exam:    BP 106/60   Pulse 76   Ht 5\' 7"  (1.702 m)   Wt 219 lb (99.3 kg)   BMI 34.30 kg/m  Wt Readings from Last 3 Encounters:  06/28/23 219 lb (99.3 kg)  06/08/23 221 lb 1.6 oz (100.3 kg)  06/07/23 224 lb (101.6 kg)   Constitutional:  Well-developed, in no acute distress. Psychiatric: Normal mood and affect. Behavior is normal. HEENT: Pupils normal.  Conjunctivae are normal. No scleral icterus. Cardiovascular: Normal rate, regular rhythm. No edema Pulmonary/chest: Effort normal and breath sounds normal. No wheezing, rales or rhonchi. Abdominal: Soft, nondistended. Nontender. Bowel sounds  active throughout. There are no masses palpable. No hepatomegaly. Rectal: Deferred Neurological: Alert and oriented to person place and time. Skin: Skin is warm and dry. No rashes noted.   Edman Circle, MD 06/28/2023, 9:13 AM  Cc: Buckner Malta, MD

## 2023-06-28 NOTE — Patient Instructions (Signed)
_______________________________________________________  If your blood pressure at your visit was 140/90 or greater, please contact your primary care physician to follow up on this.  _______________________________________________________  If you are age 62 or older, your body mass index should be between 23-30. Your Body mass index is 34.3 kg/m. If this is out of the aforementioned range listed, please consider follow up with your Primary Care Provider.  If you are age 50 or younger, your body mass index should be between 19-25. Your Body mass index is 34.3 kg/m. If this is out of the aformentioned range listed, please consider follow up with your Primary Care Provider.   ________________________________________________________  The  GI providers would like to encourage you to use [facility] to communicate with providers for non-urgent requests or questions.  Due to long hold times on the telephone, sending your provider a message by [facility] may be a faster and more efficient way to get a response.  Please allow 48 business hours for a response.  Please remember that this is for non-urgent requests.  _______________________________________________________  Your provider has requested that you go to the basement level for lab work before leaving today. Press "B" on the elevator. The lab is located at the first door on the left as you exit the elevator.   No [place] 12 hours Before the [employer] have been scheduled for a HIDA scan at [facility] (1st floor) on 07-09-2023. Please arrive 30 minutes prior to your scheduled appointment at  8am. Make certain not to have anything to eat or drink at least midnight prior to your test. Should this appointment date or time not work well for you, please call radiology scheduling at [phone].  _____________________________________________________________________ hepatobiliary (HIDA) scan is an imaging procedure used to diagnose  problems in the liver, gallbladder and bile ducts. In the HIDA scan, a radioactive chemical or tracer is injected into a vein in your arm. The tracer is handled by the liver like bile. Bile is a fluid produced and excreted by your liver that helps your digestive system break down fats in the foods you eat. Bile is stored in your gallbladder and the gallbladder releases the bile when you eat a meal. A special nuclear medicine scanner (gamma camera) tracks the flow of the tracer from your liver into your gallbladder and small intestine.  During your HIDA scan  You'll be asked to change into a hospital gown before your HIDA scan begins. Your health care team will position you on a table, usually on your back. The radioactive tracer is then injected into a vein in your arm.The tracer travels through your bloodstream to your liver, where it's taken up by the bile-producing cells. The radioactive tracer travels with the bile from your liver into your gallbladder and through your bile ducts to your small intestine.You may feel some pressure while the radioactive tracer is injected into your vein. As you lie on the table, a special gamma camera is positioned over your abdomen taking pictures of the tracer as it moves through your body. The gamma camera takes pictures continually for about an hour. You'll need to keep still during the HIDA scan. This can become uncomfortable, but you may find that you can lessen the discomfort by taking deep breaths and thinking about other things. Tell your health care team if you're uncomfortable. The radiologist will watch on a computer the progress of the radioactive tracer through your body. The HIDA scan may be stopped when the radioactive  tracer is seen in the gallbladder and enters your small intestine. This typically takes about an hour. In some cases extra imaging will be performed if original images aren't satisfactory, if morphine is given to help visualize the gallbladder or  if the medication CCK is given to look at the contraction of the gallbladder. This test typically takes 2 hours to complete. ________________________________________________________________________  Thank you,  Dr. Lynann Bologna

## 2023-07-01 ENCOUNTER — Ambulatory Visit (INDEPENDENT_AMBULATORY_CARE_PROVIDER_SITE_OTHER): Payer: Medicare HMO

## 2023-07-01 ENCOUNTER — Ambulatory Visit: Payer: Medicare HMO | Attending: Cardiology

## 2023-07-01 DIAGNOSIS — I6930 Unspecified sequelae of cerebral infarction: Secondary | ICD-10-CM

## 2023-07-01 DIAGNOSIS — R55 Syncope and collapse: Secondary | ICD-10-CM

## 2023-07-01 LAB — ECHOCARDIOGRAM COMPLETE: S' Lateral: 2.7 cm

## 2023-07-06 DIAGNOSIS — M5136 Other intervertebral disc degeneration, lumbar region: Secondary | ICD-10-CM | POA: Diagnosis not present

## 2023-07-06 DIAGNOSIS — M5416 Radiculopathy, lumbar region: Secondary | ICD-10-CM | POA: Diagnosis not present

## 2023-07-06 DIAGNOSIS — M5030 Other cervical disc degeneration, unspecified cervical region: Secondary | ICD-10-CM | POA: Diagnosis not present

## 2023-07-06 DIAGNOSIS — M542 Cervicalgia: Secondary | ICD-10-CM | POA: Diagnosis not present

## 2023-07-06 DIAGNOSIS — Z1389 Encounter for screening for other disorder: Secondary | ICD-10-CM | POA: Diagnosis not present

## 2023-07-06 DIAGNOSIS — M797 Fibromyalgia: Secondary | ICD-10-CM | POA: Diagnosis not present

## 2023-07-06 DIAGNOSIS — M47816 Spondylosis without myelopathy or radiculopathy, lumbar region: Secondary | ICD-10-CM | POA: Diagnosis not present

## 2023-07-06 DIAGNOSIS — M549 Dorsalgia, unspecified: Secondary | ICD-10-CM | POA: Diagnosis not present

## 2023-07-06 DIAGNOSIS — G894 Chronic pain syndrome: Secondary | ICD-10-CM | POA: Diagnosis not present

## 2023-07-08 ENCOUNTER — Telehealth: Payer: Self-pay

## 2023-07-08 NOTE — Telephone Encounter (Signed)
Left message on My Chart with normal results per Dr. Krasowski's note. Routed to PCP. 

## 2023-07-09 ENCOUNTER — Ambulatory Visit (HOSPITAL_COMMUNITY)
Admission: RE | Admit: 2023-07-09 | Discharge: 2023-07-09 | Disposition: A | Discharge status: Home / Self Care | Payer: Medicare HMO | Source: Ambulatory Visit | Attending: Gastroenterology | Admitting: Gastroenterology

## 2023-07-09 DIAGNOSIS — R1031 Right lower quadrant pain: Secondary | ICD-10-CM

## 2023-07-09 DIAGNOSIS — R1310 Dysphagia, unspecified: Secondary | ICD-10-CM

## 2023-07-09 DIAGNOSIS — R634 Abnormal weight loss: Secondary | ICD-10-CM | POA: Diagnosis not present

## 2023-07-09 DIAGNOSIS — R1013 Epigastric pain: Secondary | ICD-10-CM

## 2023-07-09 DIAGNOSIS — Z8719 Personal history of other diseases of the digestive system: Secondary | ICD-10-CM | POA: Insufficient documentation

## 2023-07-09 DIAGNOSIS — Z800 Family history of malignant neoplasm of digestive organs: Secondary | ICD-10-CM | POA: Diagnosis not present

## 2023-07-09 DIAGNOSIS — R1032 Left lower quadrant pain: Secondary | ICD-10-CM | POA: Diagnosis not present

## 2023-07-09 DIAGNOSIS — K582 Mixed irritable bowel syndrome: Secondary | ICD-10-CM | POA: Insufficient documentation

## 2023-07-09 DIAGNOSIS — K219 Gastro-esophageal reflux disease without esophagitis: Secondary | ICD-10-CM | POA: Insufficient documentation

## 2023-07-09 MED ORDER — TECHNETIUM TC 99M MEBROFENIN IV KIT: 5.0000 | PACK | Freq: Once | INTRAVENOUS | Status: DC | PRN

## 2023-07-23 DIAGNOSIS — E538 Deficiency of other specified B group vitamins: Secondary | ICD-10-CM | POA: Diagnosis not present

## 2023-07-23 DIAGNOSIS — Z6834 Body mass index (BMI) 34.0-34.9, adult: Secondary | ICD-10-CM | POA: Diagnosis not present

## 2023-07-23 DIAGNOSIS — K838 Other specified diseases of biliary tract: Secondary | ICD-10-CM | POA: Diagnosis not present

## 2023-07-23 DIAGNOSIS — Z23 Encounter for immunization: Secondary | ICD-10-CM | POA: Diagnosis not present

## 2023-07-23 DIAGNOSIS — G8929 Other chronic pain: Secondary | ICD-10-CM | POA: Diagnosis not present

## 2023-07-23 DIAGNOSIS — Z96652 Presence of left artificial knee joint: Secondary | ICD-10-CM | POA: Diagnosis not present

## 2023-07-23 DIAGNOSIS — E559 Vitamin D deficiency, unspecified: Secondary | ICD-10-CM | POA: Diagnosis not present

## 2023-07-23 DIAGNOSIS — M25562 Pain in left knee: Secondary | ICD-10-CM | POA: Diagnosis not present

## 2023-08-01 IMAGING — CT CT ABD-PELV W/ CM
2 of 5 series · 16 of 46 positions shown, 18 images · IV contrast (Omnipaque)
Comparison: None.

CLINICAL DATA: Bilateral lower abdominal pain.

EXAM:
CT ABDOMEN AND PELVIS WITH CONTRAST
TECHNIQUE: Multidetector CT imaging of the abdomen and pelvis was performed
using the standard protocol following bolus administration of
intravenous contrast.
CONTRAST:  100mL OMNIPAQUE IOHEXOL 300 MG/ML  SOLN

[Series 2: axial st · axial · 0.98mm/px · z∈[-476,-51]mm · 13 of 97 slices shown, 15 images]
[im 6/97  soft-tissue]
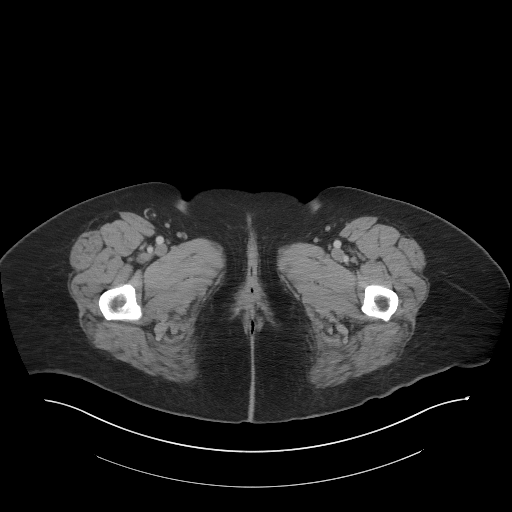
[im 6/97  bone]
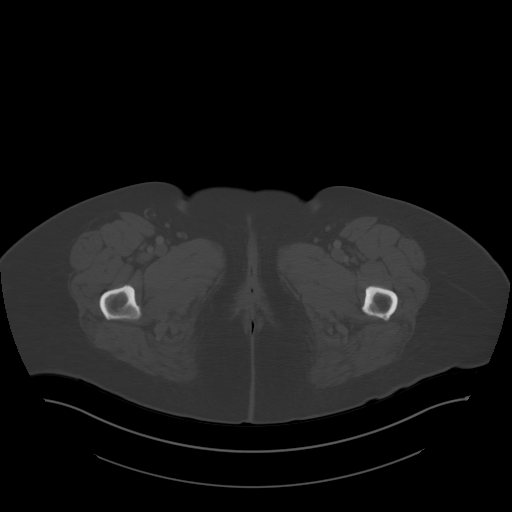
[im 16/97  soft-tissue]
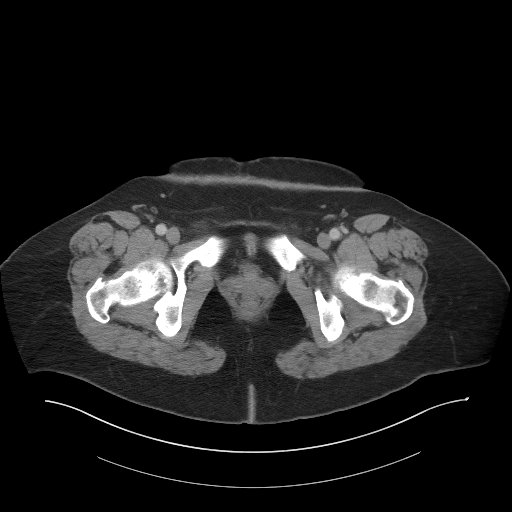
[im 21/97  soft-tissue]
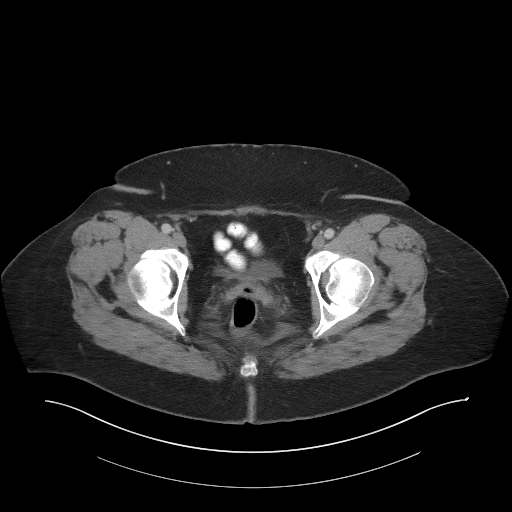
[im 26/97  soft-tissue]
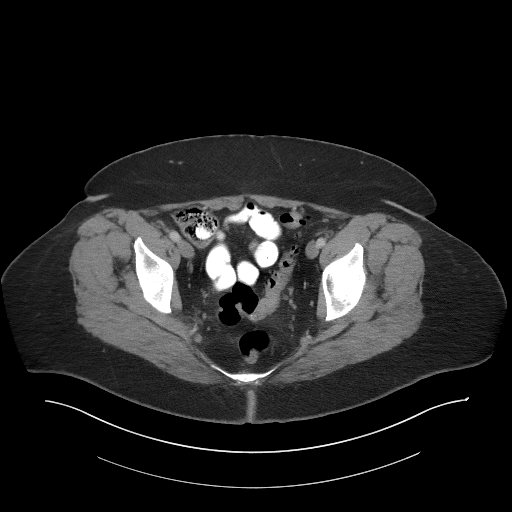
[im 36/97  soft-tissue]
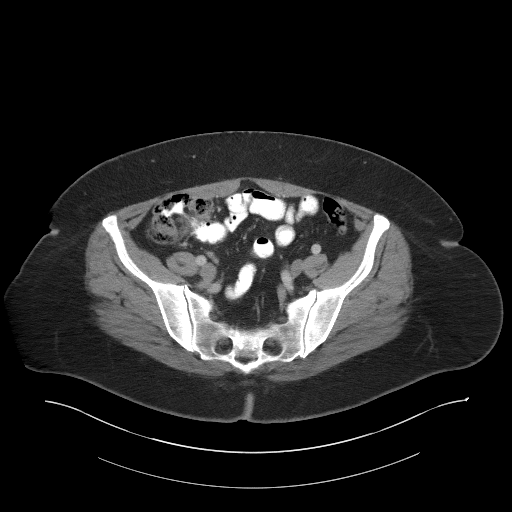
[im 41/97  soft-tissue]
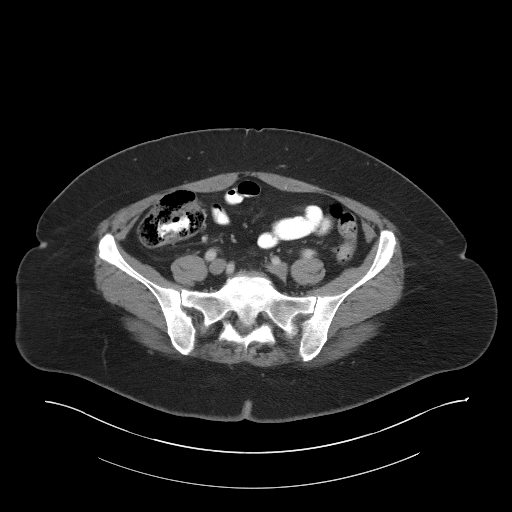
[im 51/97  soft-tissue]
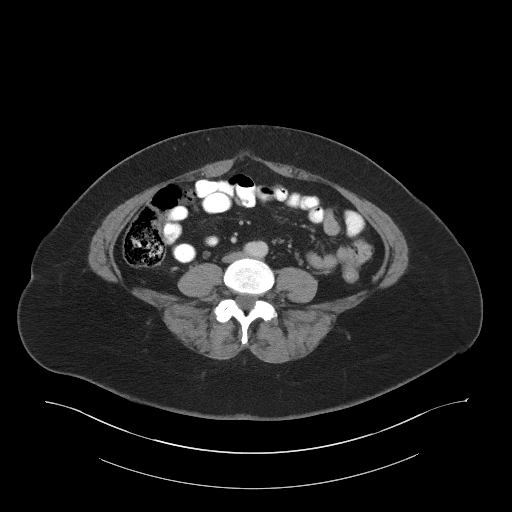
[im 56/97  soft-tissue]
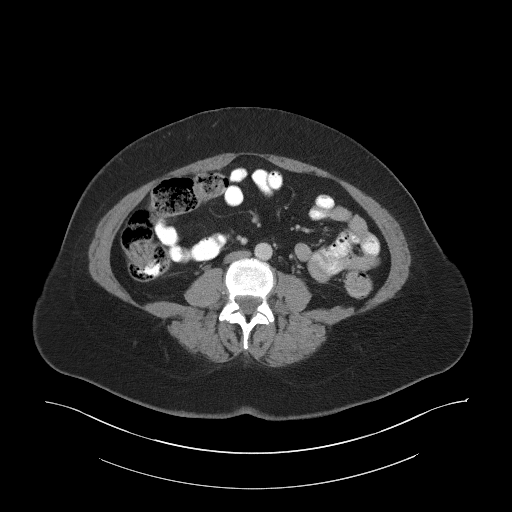
[im 61/97  soft-tissue]
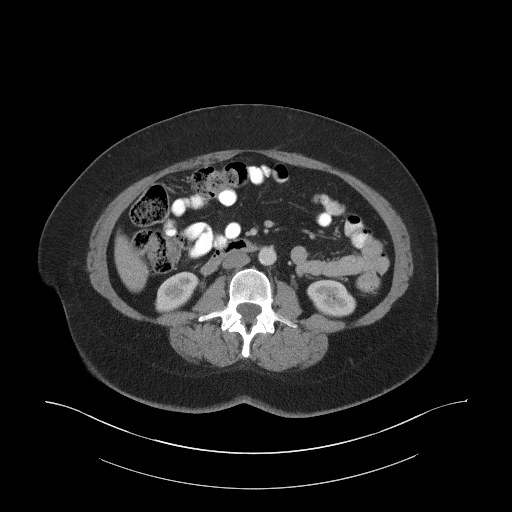
[im 61/97  bone]
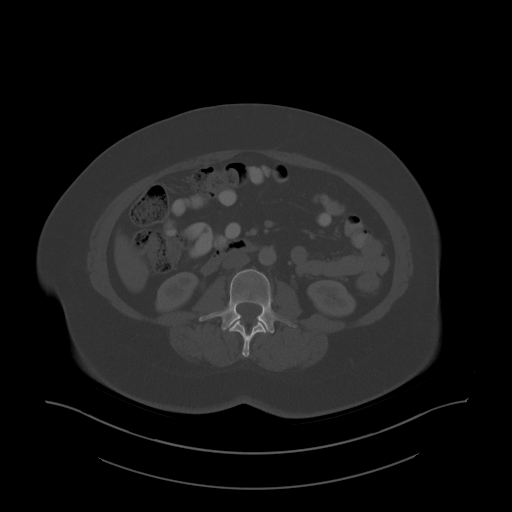
[im 71/97  soft-tissue]
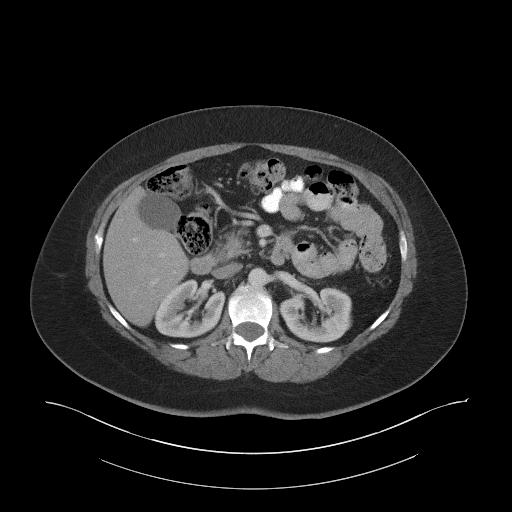
[im 76/97  soft-tissue]
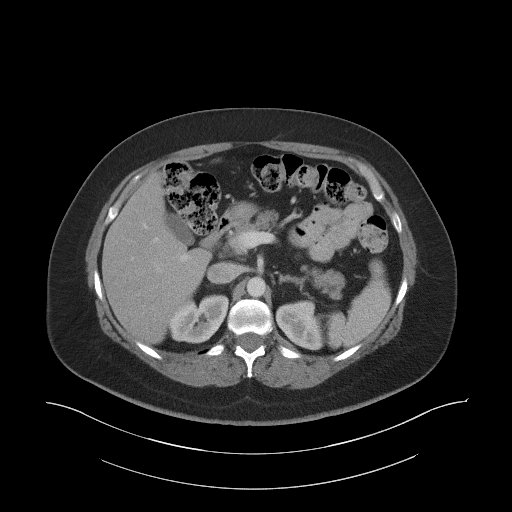
[im 81/97  soft-tissue]
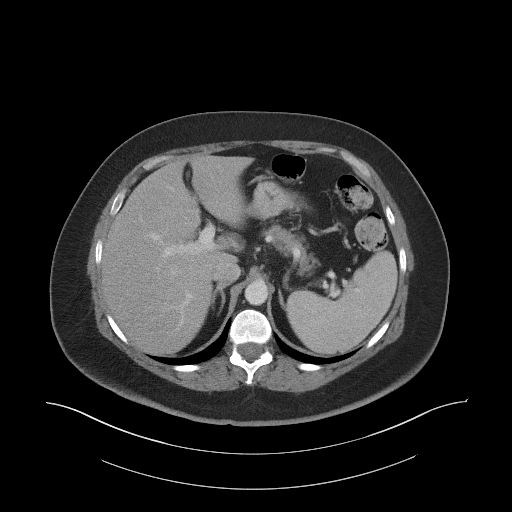
[im 91/97  soft-tissue]
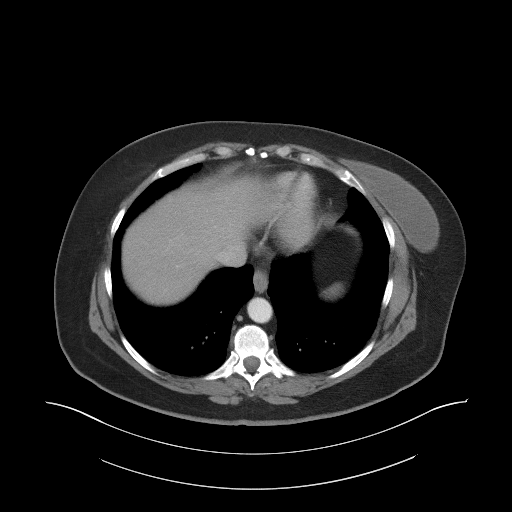

[Series 5: coronal st · coronal · 1.01mm/px · 3 of 104 slices shown]
[im 35/104  soft-tissue]
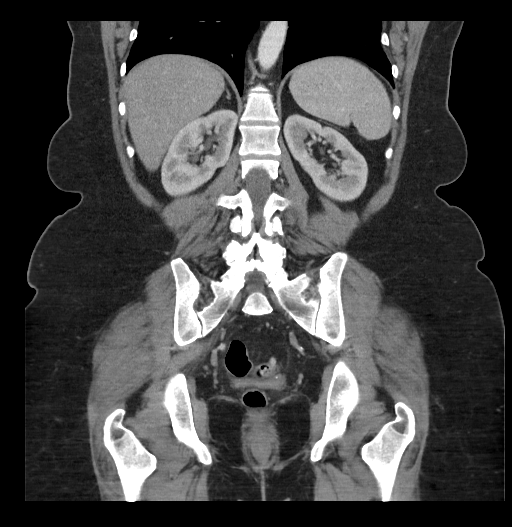
[im 46/104  soft-tissue]
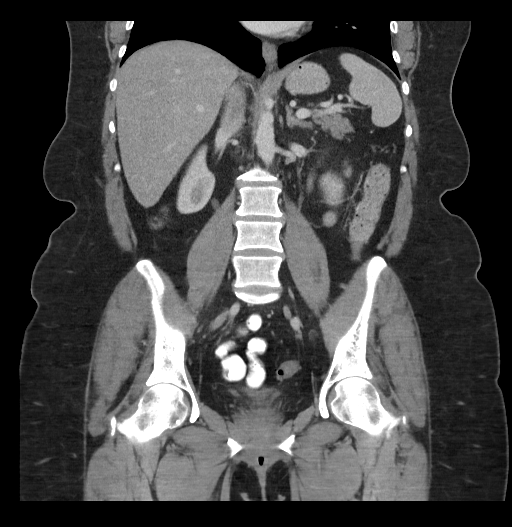
[im 58/104  soft-tissue]
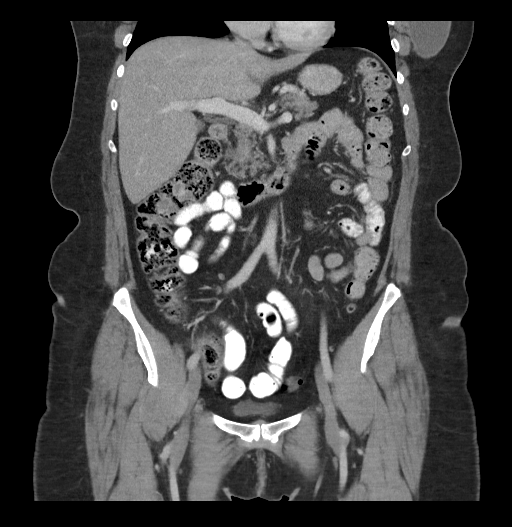

[16 of 46 positions shown; findings below may reference images not displayed]

FINDINGS: Lower chest: No acute abnormality.

Hepatobiliary: Diffuse hepatic steatosis. Tiny hypodense lesion left
lobe of the liver is technically too small to accurately
characterize but statistically likely to reflect cyst. Gallbladder
is unremarkable. No biliary ductal dilation.

Pancreas: No pancreatic ductal dilation or evidence of acute
inflammation.

Spleen: Normal in size without focal abnormality.

Adrenals/Urinary Tract: Bilateral adrenal glands are unremarkable.
No hydronephrosis. No solid enhancing renal mass. Urinary bladder is
decompressed limiting evaluation.

Stomach/Bowel: Radiopaque enteric contrast material traverses the
sigmoid colon. Small hiatal hernia otherwise the stomach is
decompressed limiting evaluation. No pathologic dilation of small or
large bowel. Terminal ileum is unremarkable. Appendix is
unremarkable. Sigmoid colonic diverticulosis without evidence of
acute diverticulitis. The sigmoid colon is intimately associated
with the left vaginal cuff for instance on sagittal image 84/6, with
gas and trace hyperdense material in the vagina for instance on
image 79/2.

Vascular/Lymphatic: Scattered aortic atherosclerosis without
abdominal aortic aneurysm. No pathologically enlarged abdominal or
pelvic lymph nodes.

Reproductive: Status post hysterectomy. Please see above regarding
findings in the vagina and at the vaginal cuff. No adnexal masses.

Other: No significant abdominopelvic free fluid. No
pneumoperitoneum.

Musculoskeletal: Mild thoracolumbar spondylosis. No acute osseous
abnormality.
IMPRESSION: 1. Extensive sigmoid colonic diverticulosis with a portion of
sigmoid colon intimately associated with the left vaginal cuff, with
gas and trace hyperdense material in the vagina. Findings are
suspicious for a colovaginal fistula. Consider more definitive
characterization with CT abdomen and pelvis with IV and per rectal
contrast material. No evidence of acute diverticulitis.
2. Diffuse hepatic steatosis.
3.  Aortic Atherosclerosis ([id number]).

## 2023-08-07 DIAGNOSIS — J069 Acute upper respiratory infection, unspecified: Secondary | ICD-10-CM | POA: Diagnosis not present

## 2023-08-07 DIAGNOSIS — R051 Acute cough: Secondary | ICD-10-CM | POA: Diagnosis not present

## 2023-08-07 DIAGNOSIS — R509 Fever, unspecified: Secondary | ICD-10-CM | POA: Diagnosis not present

## 2023-08-07 DIAGNOSIS — R059 Cough, unspecified: Secondary | ICD-10-CM | POA: Diagnosis not present

## 2023-08-09 NOTE — Progress Notes (Signed)
 Cardiology Office Note:  .   Date:  08/10/2023  ID:  Laura Hill, DOB May 11, 1961, MRN [id number] PCP: Buckner Malta, MD  [facility] Providers Cardiologist:  None    History of Present Illness: .   Laura Hill is a 62 y.o. female with a past medical history of migraines, OSA on CPAP, GERD, IBS, hypothyroidism, history of breast cancer, fibromyalgia, stroke, dyslipidemia.  She established care with Dr. Bing Matter on 06/07/2023 for evaluation of syncope at the request of her PCP.  Episode of syncope at occurred as she woke up in the middle of the night to take her dog outside, felt dizzy, and then passed out.  She had obtained a CT of her head by her PCP which showed an old infarct.  Carotid ultrasound was arranged and completed on 07/01/2023 this revealed no stenosis bilaterally.  Echocardiogram was arranged and completed on 07/01/2023 this revealed an EF of 60 to 65%, mild MR.  A monitor was arranged which revealed an average heart rate 83 bpm, predominant rhythm was sinus, 1 run of VT lasting 7 beats, 7 episodes of SVT.  She presents today for follow-up of her syncopal event as outlined above.  She has not had further recurrence, she does have episodes of dizziness where she feels lightheaded.  Interestingly enough, her daughter apparently has POTS/dysautonomia.  She is likely not staying well-hydrated, drinking about 32 ounces of water per day, her blood pressure is 120/79.  She was previously exercising however has had to stop this recently.  Encouraged her to increase walking. She denies chest pain, palpitations, dyspnea, pnd, orthopnea, n, v, dizziness, syncope, edema, weight gain, or early satiety.   ROS: Review of Systems  Neurological:  Positive for dizziness.  All other systems reviewed and are negative.    Studies Reviewed: .        Cardiac Studies & Procedures       ECHOCARDIOGRAM  ECHOCARDIOGRAM COMPLETE 07/01/2023  Narrative ECHOCARDIOGRAM REPORT    Patient  Name:   Laura Hill Date of Exam: 07/01/2023 Medical Rec #:  [id number]      Height:       67.0 in Accession #:    [id number]     Weight:       219.0 lb Date of Birth:  December 15, 1960      BSA:          2.102 m Patient Age:    61 years       BP:           106/60 mmHg Patient Gender: F              HR:           71 bpm. Exam Location:  [facility]  Procedure: 2D Echo, Cardiac Doppler, Color Doppler and Strain Analysis  Indications:    Syncope and collapse [I34 (ICD-10-CM)]  History:        Patient has no prior history of Echocardiogram examinations. Bilateral breast cancer; Risk Factors:Dyslipidemia.  Sonographer:    Louie Boston RDCS Referring Phys: [phone] Marveen Reeks KRASOWSKI  IMPRESSIONS   1. Left ventricular ejection fraction, by estimation, is 60 to 65%. The left ventricle has normal function. The left ventricle has no regional wall motion abnormalities. Left ventricular diastolic parameters were normal. The average left ventricular global longitudinal strain is -17.7 %. The global longitudinal strain is normal. 2. Right ventricular systolic function is normal. The right ventricular size is normal. There is normal pulmonary artery  systolic pressure. 3. The mitral valve is normal in structure. Mild mitral valve regurgitation. No evidence of mitral stenosis. 4. The aortic valve is normal in structure. Aortic valve regurgitation is not visualized. No aortic stenosis is present. 5. The inferior vena cava is normal in size with greater than 50% respiratory variability, suggesting right atrial pressure of 3 mmHg.  FINDINGS Left Ventricle: Left ventricular ejection fraction, by estimation, is 60 to 65%. The left ventricle has normal function. The left ventricle has no regional wall motion abnormalities. The average left ventricular global longitudinal strain is -17.7 %. The global longitudinal strain is normal. The left ventricular internal cavity size was normal in size. There is no left  ventricular hypertrophy. Left ventricular diastolic parameters were normal.  Right Ventricle: The right ventricular size is normal. No increase in right ventricular wall thickness. Right ventricular systolic function is normal. There is normal pulmonary artery systolic pressure. The tricuspid regurgitant velocity is 1.97 m/s, and with an assumed right atrial pressure of 3 mmHg, the estimated right ventricular systolic pressure is 18.5 mmHg.  Left Atrium: Left atrial size was normal in size.  Right Atrium: Right atrial size was normal in size.  Pericardium: There is no evidence of pericardial effusion.  Mitral Valve: The mitral valve is normal in structure. Mild mitral valve regurgitation. No evidence of mitral valve stenosis.  Tricuspid Valve: The tricuspid valve is normal in structure. Tricuspid valve regurgitation is mild . No evidence of tricuspid stenosis.  Aortic Valve: The aortic valve is normal in structure. Aortic valve regurgitation is not visualized. No aortic stenosis is present.  Pulmonic Valve: The pulmonic valve was normal in structure. Pulmonic valve regurgitation is not visualized. No evidence of pulmonic stenosis.  Aorta: The aortic root is normal in size and structure.  Venous: The inferior vena cava is normal in size with greater than 50% respiratory variability, suggesting right atrial pressure of 3 mmHg.  IAS/Shunts: No atrial level shunt detected by color flow Doppler.   LEFT VENTRICLE PLAX 2D LVIDd:         4.30 cm   Diastology LVIDs:         2.70 cm   LV e' medial:    9.57 cm/s LV PW:         1.10 cm   LV E/e' medial:  6.8 LV IVS:        0.90 cm   LV e' lateral:   9.46 cm/s LVOT diam:     2.00 cm   LV E/e' lateral: 6.9 LV SV:         56 LV SV Index:   27        2D Longitudinal Strain LVOT Area:     3.14 cm  2D Strain GLS Avg:     -17.7 %   RIGHT VENTRICLE             IVC RV Basal diam:  3.00 cm     IVC diam: 1.50 cm RV S prime:     11.70 cm/s TAPSE  (M-mode): 2.3 cm  LEFT ATRIUM           Index        RIGHT ATRIUM           Index LA diam:      3.50 cm 1.67 cm/m   RA Area:     13.90 cm LA Vol (A2C): 30.1 ml 14.32 ml/m  RA Volume:   34.50 ml  16.41 ml/m LA Vol (A4C):  55.1 ml 26.22 ml/m AORTIC VALVE LVOT Vmax:   81.97 cm/s LVOT Vmean:  55.167 cm/s LVOT VTI:    0.180 m  AORTA Ao Root diam: 3.20 cm Ao Asc diam:  3.20 cm Ao Desc diam: 3.10 cm  MV E velocity: 65.25 cm/s  TRICUSPID VALVE MV A velocity: 67.60 cm/s  TR Peak grad:   15.5 mmHg MV E/A ratio:  0.97        TR Vmax:        197.00 cm/s  SHUNTS Systemic VTI:  0.18 m Systemic Diam: 2.00 cm  Gypsy Balsam MD Electronically signed by Gypsy Balsam MD Signature Date/Time: 07/01/2023/12:08:24 PM    Final    MONITORS  LONG TERM MONITOR (3-14 DAYS) 06/28/2023  Narrative Patch Wear Time:  10 days and 13 hours (2024-07-22T09:37:22-0400 to 2024-08-01T23:25:31-0400)  Patient had a min HR of 49 bpm, max HR of 226 bpm, and avg HR of 83 bpm. Predominant underlying rhythm was Sinus Rhythm. 1 run of Ventricular Tachycardia occurred lasting 7 beats with a max rate of 135 bpm (avg 125 bpm). 7 Supraventricular Tachycardia runs occurred, the run with the fastest interval lasting 5 beats with a max rate of 226 bpm, the longest lasting 15 beats with an avg rate of 128 bpm. Isolated SVEs were rare (<1.0%), SVE Couplets were rare (<1.0%), and SVE Triplets were rare (<1.0%). Isolated VEs were rare (<1.0%, 353), VE Couplets were rare (<1.0%, 7), and VE Triplets were rare (<1.0%, 1).  Summary and conclusions: 1 ventricular tachycardia total of 7 beats at rate of 135 bpm.  Asymptomatic. 7 episode of supraventricular tachycardia with longest episode 15 beats at rate of 128.  Asymptomatic           Risk Assessment/Calculations:             Physical Exam:   VS:  BP 120/79 (BP Location: Left Arm, Patient Position: Sitting, Cuff Size: Large)   Pulse 72   Ht 5\' 7"  (1.702 m)   Wt  221 lb (100.2 kg)   SpO2 93%   BMI 34.61 kg/m    Wt Readings from Last 3 Encounters:  08/10/23 221 lb (100.2 kg)  06/28/23 219 lb (99.3 kg)  06/08/23 221 lb 1.6 oz (100.3 kg)    GEN: Well nourished, well developed in no acute distress NECK: No JVD; No carotid bruits CARDIAC: RRR, 1/6 systolic murmur, rubs, gallops RESPIRATORY:  Clear to auscultation without rales, wheezing or rhonchi  ABDOMEN: Soft, non-tender, non-distended EXTREMITIES:  No edema; No deformity   ASSESSMENT AND PLAN: .   Syncope and collapse-1 episode as outlined above in the HPI, no further recurrence.  Looking back, she has had near syncopal events leading back to her teenage years.  Sounds like it could be a component of dysautonomia--encouraged increased oral hydration, increase salt intake, increase exercise.  Echo was unrevealing, carotid artery ultrasound was unrevealing.  She did wear a monitor that revealed 1 episode of NSVT and several episodes of SVT, she is otherwise unbothered by palpitations.  Palpitations/NSVT, SVT-quiescent, she was very bothered by palpitations earlier in her life however does not notice them now.  History of stroke-this was noted on MRI that was arranged by her PCP, evidence of old infarct.  She was started on aspirin by her PCP and her Crestor was increased.  Dyslipidemia-monitored by her PCP, again Crestor was recently increased secondary to evidence of prior infarct.       Dispo: Follow up in 1 year.  Signed, Flossie Dibble, NP

## 2023-08-10 ENCOUNTER — Encounter: Payer: Self-pay | Admitting: Cardiology

## 2023-08-10 ENCOUNTER — Ambulatory Visit: Payer: Medicare HMO | Attending: Cardiology | Admitting: Cardiology

## 2023-08-10 VITALS — BP 120/79 | HR 72 | Ht 67.0 in | Wt 221.0 lb

## 2023-08-10 DIAGNOSIS — I4729 Other ventricular tachycardia: Secondary | ICD-10-CM

## 2023-08-10 DIAGNOSIS — I6930 Unspecified sequelae of cerebral infarction: Secondary | ICD-10-CM

## 2023-08-10 DIAGNOSIS — E785 Hyperlipidemia, unspecified: Secondary | ICD-10-CM

## 2023-08-10 DIAGNOSIS — G4730 Sleep apnea, unspecified: Secondary | ICD-10-CM

## 2023-08-10 DIAGNOSIS — I471 Supraventricular tachycardia, unspecified: Secondary | ICD-10-CM | POA: Diagnosis not present

## 2023-08-10 DIAGNOSIS — R55 Syncope and collapse: Secondary | ICD-10-CM | POA: Diagnosis not present

## 2023-08-10 DIAGNOSIS — R000 Tachycardia, unspecified: Secondary | ICD-10-CM

## 2023-08-10 NOTE — Patient Instructions (Signed)
Medication Instructions:  Your physician recommends that you continue on your current medications as directed. Please refer to the Current Medication list given to you today.  *If you need a refill on your cardiac medications before your next appointment, please call your pharmacy*   Lab Work: NONE If you have labs (blood work) drawn today and your tests are completely normal, you will receive your results only by: MyChart Message (if you have MyChart) OR A paper copy in the mail If you have any lab test that is abnormal or we need to change your treatment, we will call you to review the results.   Testing/Procedures: NONE   Follow-Up: At [facility], you and your health needs are our priority.  As part of our continuing mission to provide you with exceptional heart care, we have created designated Provider Care Teams.  These Care Teams include your primary Cardiologist (physician) and Advanced Practice Providers (APPs -  Physician Assistants and Nurse Practitioners) who all work together to provide you with the care you need, when you need it.  We recommend signing up for the patient portal called "MyChart".  Sign up information is provided on this After Visit Summary.  MyChart is used to connect with patients for Virtual Visits (Telemedicine).  Patients are able to view lab/test results, encounter notes, upcoming appointments, etc.  Non-urgent messages can be sent to your provider as well.   To learn more about what you can do with MyChart, go to [email or web address].    Your next appointment:   1 year(s)  Provider:   Gypsy Balsam, MD    Other Instructions

## 2023-08-31 DIAGNOSIS — M5030 Other cervical disc degeneration, unspecified cervical region: Secondary | ICD-10-CM | POA: Diagnosis not present

## 2023-08-31 DIAGNOSIS — Z1389 Encounter for screening for other disorder: Secondary | ICD-10-CM | POA: Diagnosis not present

## 2023-08-31 DIAGNOSIS — M549 Dorsalgia, unspecified: Secondary | ICD-10-CM | POA: Diagnosis not present

## 2023-08-31 DIAGNOSIS — M47816 Spondylosis without myelopathy or radiculopathy, lumbar region: Secondary | ICD-10-CM | POA: Diagnosis not present

## 2023-08-31 DIAGNOSIS — M542 Cervicalgia: Secondary | ICD-10-CM | POA: Diagnosis not present

## 2023-08-31 DIAGNOSIS — M797 Fibromyalgia: Secondary | ICD-10-CM | POA: Diagnosis not present

## 2023-08-31 DIAGNOSIS — G894 Chronic pain syndrome: Secondary | ICD-10-CM | POA: Diagnosis not present

## 2023-08-31 DIAGNOSIS — M5416 Radiculopathy, lumbar region: Secondary | ICD-10-CM | POA: Diagnosis not present

## 2023-10-26 DIAGNOSIS — M549 Dorsalgia, unspecified: Secondary | ICD-10-CM | POA: Diagnosis not present

## 2023-10-26 DIAGNOSIS — M797 Fibromyalgia: Secondary | ICD-10-CM | POA: Diagnosis not present

## 2023-10-26 DIAGNOSIS — M47816 Spondylosis without myelopathy or radiculopathy, lumbar region: Secondary | ICD-10-CM | POA: Diagnosis not present

## 2023-10-26 DIAGNOSIS — M542 Cervicalgia: Secondary | ICD-10-CM | POA: Diagnosis not present

## 2023-10-26 DIAGNOSIS — Z1389 Encounter for screening for other disorder: Secondary | ICD-10-CM | POA: Diagnosis not present

## 2023-10-26 DIAGNOSIS — G894 Chronic pain syndrome: Secondary | ICD-10-CM | POA: Diagnosis not present

## 2023-10-26 DIAGNOSIS — M5030 Other cervical disc degeneration, unspecified cervical region: Secondary | ICD-10-CM | POA: Diagnosis not present

## 2023-11-07 DIAGNOSIS — Z0100 Encounter for examination of eyes and vision without abnormal findings: Secondary | ICD-10-CM | POA: Diagnosis not present

## 2023-12-10 ENCOUNTER — Inpatient Hospital Stay: Payer: Medicare HMO | Attending: Oncology | Admitting: Oncology

## 2023-12-10 ENCOUNTER — Other Ambulatory Visit: Payer: Self-pay | Admitting: Oncology

## 2023-12-10 ENCOUNTER — Telehealth: Payer: Self-pay

## 2023-12-10 ENCOUNTER — Inpatient Hospital Stay: Payer: Medicare HMO

## 2023-12-10 ENCOUNTER — Other Ambulatory Visit: Payer: Self-pay

## 2023-12-10 ENCOUNTER — Encounter: Payer: Self-pay | Admitting: Oncology

## 2023-12-10 VITALS — BP 119/74 | HR 79 | Temp 97.6°F | Resp 16 | Ht 67.0 in | Wt 235.9 lb

## 2023-12-10 DIAGNOSIS — Z79899 Other long term (current) drug therapy: Secondary | ICD-10-CM | POA: Insufficient documentation

## 2023-12-10 DIAGNOSIS — Z1502 Genetic susceptibility to malignant neoplasm of ovary: Secondary | ICD-10-CM | POA: Diagnosis not present

## 2023-12-10 DIAGNOSIS — Z1501 Genetic susceptibility to malignant neoplasm of breast: Secondary | ICD-10-CM | POA: Diagnosis not present

## 2023-12-10 DIAGNOSIS — Z7982 Long term (current) use of aspirin: Secondary | ICD-10-CM | POA: Diagnosis not present

## 2023-12-10 DIAGNOSIS — C50912 Malignant neoplasm of unspecified site of left female breast: Secondary | ICD-10-CM

## 2023-12-10 DIAGNOSIS — C773 Secondary and unspecified malignant neoplasm of axilla and upper limb lymph nodes: Secondary | ICD-10-CM

## 2023-12-10 DIAGNOSIS — Z853 Personal history of malignant neoplasm of breast: Secondary | ICD-10-CM | POA: Insufficient documentation

## 2023-12-10 DIAGNOSIS — Z9013 Acquired absence of bilateral breasts and nipples: Secondary | ICD-10-CM | POA: Insufficient documentation

## 2023-12-10 DIAGNOSIS — Z1509 Genetic susceptibility to other malignant neoplasm: Secondary | ICD-10-CM

## 2023-12-10 DIAGNOSIS — R8290 Unspecified abnormal findings in urine: Secondary | ICD-10-CM | POA: Diagnosis not present

## 2023-12-10 DIAGNOSIS — E063 Autoimmune thyroiditis: Secondary | ICD-10-CM | POA: Insufficient documentation

## 2023-12-10 LAB — CBC WITH DIFFERENTIAL (CANCER CENTER ONLY)
Abs Immature Granulocytes: 0.03 10*3/uL (ref 0.00–0.07)
Basophils Absolute: 0.1 10*3/uL (ref 0.0–0.1)
Basophils Relative: 1 %
Eosinophils Absolute: 0.5 10*3/uL (ref 0.0–0.5)
Eosinophils Relative: 6 %
HCT: 42.2 % (ref 36.0–46.0)
Hemoglobin: 14.1 g/dL (ref 12.0–15.0)
Immature Granulocytes: 0 %
Lymphocytes Relative: 31 %
Lymphs Abs: 2.4 10*3/uL (ref 0.7–4.0)
MCH: 28.4 pg (ref 26.0–34.0)
MCHC: 33.4 g/dL (ref 30.0–36.0)
MCV: 84.9 fL (ref 80.0–100.0)
Monocytes Absolute: 0.5 10*3/uL (ref 0.1–1.0)
Monocytes Relative: 7 %
Neutro Abs: 4.3 10*3/uL (ref 1.7–7.7)
Neutrophils Relative %: 55 %
Platelet Count: 279 10*3/uL (ref 150–400)
RBC: 4.97 MIL/uL (ref 3.87–5.11)
RDW: 14 % (ref 11.5–15.5)
WBC Count: 7.9 10*3/uL (ref 4.0–10.5)
nRBC: 0 % (ref 0.0–0.2)
nRBC: 0 /100{WBCs}

## 2023-12-10 LAB — URINALYSIS, COMPLETE (UACMP) WITH MICROSCOPIC
Bilirubin Urine: NEGATIVE
Glucose, UA: NEGATIVE mg/dL
Hgb urine dipstick: NEGATIVE
Ketones, ur: NEGATIVE mg/dL
Leukocytes,Ua: NEGATIVE
Nitrite: NEGATIVE
Protein, ur: NEGATIVE mg/dL
Specific Gravity, Urine: 1.01 (ref 1.005–1.030)
pH: 7 (ref 5.0–8.0)

## 2023-12-10 LAB — CMP (CANCER CENTER ONLY)
ALT: 11 U/L (ref 0–44)
AST: 20 U/L (ref 15–41)
Albumin: 4.4 g/dL (ref 3.5–5.0)
Alkaline Phosphatase: 99 U/L (ref 38–126)
Anion gap: 13 (ref 5–15)
BUN: 17 mg/dL (ref 8–23)
CO2: 25 mmol/L (ref 22–32)
Calcium: 8.9 mg/dL (ref 8.9–10.3)
Chloride: 104 mmol/L (ref 98–111)
Creatinine: 0.84 mg/dL (ref 0.44–1.00)
GFR, Estimated: >60 mL/min (ref >60)
Glucose, Bld: 81 mg/dL (ref 70–99)
Potassium: 3.5 mmol/L (ref 3.5–5.1)
Sodium: 142 mmol/L (ref 135–145)
Total Bilirubin: 1.1 mg/dL (ref 0.0–1.2)
Total Protein: 6.8 g/dL (ref 6.5–8.1)

## 2023-12-10 NOTE — Telephone Encounter (Signed)
Pt called, wanted to know if you would add on an urinalysis to labs? Her last UA showed + protein. Her urine is frothy again. She would like to recheck it.

## 2023-12-10 NOTE — Progress Notes (Signed)
[name]  [address] [place],  [place]  [address] [phone]  Clinic Day: 12/10/23   Referring physician: Buckner Malta, MD  ASSESSMENT & PLAN:  Assessment: History of bilateral stage IIA breast cancers, (T2 N0 M0) on the right and (T1 N1 M0) on the left, diagnosed in 2009. 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 She received dose dense AC followed by weekly Taxol as well as endocrine therapy for a total of 10 years. She has undergone bilateral mastectomies with reconstruction. She remains without evidence of  recurrence.  BRCA2 mutation. Therefore, she has undergone bilateral mastectomies as well as hysterectomy and bilateral salpingo oophorectomy.  History of colon cancer and pancreatic cancer in the family. She did have a colonoscopy and EGD last year and the colon polyp of the transverse colon was hyperplastic. Dr. Chales Abrahams has recommended repeat in 3-5 years. He also recommends periodic abdominal scans to monitor the pancreas since her brother died of pancreatic cancer last year.   4. History of prior stroke. CT of her brain which revealed a previous stroke and she was placed on Aspirin 81mg  daily.   Plan: She had a CT scan of abdomen and pelvis done in January 2024 which revealed pancreatic atrophy but no evidence of malignancy. She has a WBC of 7.9, hemoglobin of 14.1, and platelet count of 279,000. Her CMP and urinalysis are both completely normal. I will see her back in 6 months with CBC and CMP. She understands and agrees with this plan of care. I have answered her questions and she knows to call with any concerns.   I provided 11 minutes of face-to-face time during this this encounter and > 50% was spent counseling as documented under my assessment and plan.   [name], MD [facility] - A DEPT OF [facility] [address] [place]  [address] Dept: [phone] Dept Fax: [phone]   No orders of the defined types were placed in this encounter.  CHIEF COMPLAINT:  CC: History of bilateral stage IIA breast cancers; BRCA2 positive  Current Treatment:  Surveillance  HISTORY OF PRESENT ILLNESS:  Laura Hill is a 63 y.o. female who presents as a transfer of care for the continued evaluation and management due to her history of bilateral stage IIA breast cancers, (T2 N0 M0) on the right and (T1 N1 M0) on the left. 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 This was originally diagnosed in 2009. She did receive dose dense AC followed by 12 weeks of weekly Taxol. Due to her BRCA2 positivity, she underwent bilateral mastectomies as well as hysterectomy and bilateral salpingo oophorectomy. She also received endocrine therapy with tamoxifen for 3 years followed by 7 years of letrozole. She did receive Boneva for osteopenia while on letrozole, and most recent bone density from May 2022 was normal. She did undergo bilateral breast reconstructions in 2010.  Lab evaluation from December 2022 revealed a normal CBC and CMP was unremarkable. 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 There is a tiny hypodense lesion of the left lobe of the liver, which is too small to accurately characterize, but statistically likely to reflect cyst. I reviewed the images with the  patient and have difficulty even seeing this lesion. MRI pelvis from December 23rd revealed sigmoid diverticulosis, without evidence of active diverticulitis. No definite evidence of colovaginal fistula. She lost her brother to pancreatic cancer in 2024. I scheduled a CT scan of abdomen and pelvis in January 2024. This revealed pancreatic  atrophy but no evidence of malignancy.   PAST MEDICAL HISTORY  I have reviewed her chart and materials related to her cancer extensively and collaborated history with the patient. Summary of oncologic history is as follows: Oncology History  Bilateral breast cancer (HCC)  11/22/2007 Initial Diagnosis   Breast cancer in female [facility])   07/25/2008 Cancer Staging   Staging form: Breast, AJCC 6th Edition - Clinical stage from 07/25/2008: Stage IIA (T2, N0, M0) - Signed by [name], MD on 12/15/2021 Staged by: Staging assigned at another facility Diagnostic confirmation: Positive histology Specimen type: Excision Laterality: Right Stage prefix: Initial diagnosis   Breast cancer 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 lymph node, left (HCC)  11/22/2007 Initial Diagnosis   Breast cancer 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 lymph node, left (HCC)   07/25/2008 Cancer Staging   Staging form: Breast, AJCC 6th Edition - Clinical stage from 07/25/2008: Stage IIA (T1, N1, M0) - Signed by [name], MD on 12/15/2021 Staged by: Staging assigned at another facility Diagnostic confirmation: Positive histology Specimen type: Excision Laterality: Left Total positive nodes: 2 Stage prefix: Initial diagnosis Staging comments: Adjuvant DD AC followed by Taxol, tamoxifen x 3 years, Letrozole x 7 yrs   See summary above for Oncology history   INTERVAL HISTORY:  [name] is here today for a follow up for her history of bilateral stage IIA breast cancers; BRCA2 positive. She also has a history of esophageal stricture that occasionally requires dilatation. She does have Hashimoto's disease and fibromyalgia and is on observation only. Her brother died of pancreatic cancer last year. CT of her brain revealed a previous stroke and she was placed on Aspirin 81mg  daily. Patient states that she feels well but complains of a occasional sharp or aching pain in her left lower thorax and chronic knee pain. She inquired about a urinalysis  as she has been having frothy urine. She had a CT scan of abdomen and pelvis done in January 2024 which revealed pancreatic atrophy but no evidence of malignancy. She has a WBC of 7.9, hemoglobin of 14.1, and platelet count of 279,000. Her CMP and urinalysis are both completely normal. I will see her back in 6 months with CBC and CMP. She denies signs of infection such as sore throat, sinus drainage or urinary symptoms.  She denies fevers or chills. She denies pain. She denies nausea, vomiting, chest pain, dyspnea or cough. Her appetite is good and her weight has increased 14 pounds over last 4 months . This patient is accompanied in the office by her husband.   HISTORY:   Past Medical History:  Diagnosis Date   B12 deficiency    Breast cancer (HCC) 2009   Double Masectomy and lymph node removal bilaterally   Chronic pain disorder    DDD (degenerative disc disease), cervical    Fibromyalgia    GERD (gastroesophageal reflux disease)    Hashimoto's disease    Hypercholesteremia    Hyperlipidemia    Irritable bowel syndrome (IBS)    Mass of soft tissue of chest 09/03/2022   Migraine, cervicogenic    Osteoporosis    Thyroid disease    Vitamin D deficiency     Past Surgical History:  Procedure Laterality Date   ABDOMINAL HYSTERECTOMY  2009   BREAST RECONSTRUCTION Bilateral 2010   DILATATION AND CURETTAGE/HYSTEROSCOPY WITH MINERVA     FUNCTIONAL ENDOSCOPIC SINUS SURGERY  12/24/2022   Septoplasty Submucosal Resectioning of Inferior Turbinates   LEG SURGERY     MASTECTOMY Bilateral 2009   REPLACEMENT TOTAL KNEE Bilateral    SALPINGOOPHORECTOMY Bilateral     Family History  Problem Relation Age of Onset   Colon cancer Mother 41   Multiple myeloma Mother 74   Prostate cancer Father 58   Breast cancer Sister 52   Cancer Brother 13       oral   Breast cancer Brother 63   Breast cancer Paternal Grandmother 31   Skin cancer Maternal Aunt        70s   Colon cancer Maternal Uncle 76    Stomach cancer Maternal Uncle 91   Breast cancer Paternal Aunt 88   Melanoma Cousin 46       maternal    Social History:  reports that she has never smoked. She has never used smokeless tobacco. She reports that she does not currently use alcohol. She reports that she does not use drugs.The patient is alone today. She is married and lives at home with her spouse. She has 2 biological children and has adopted two of her grandchildren. She works, and has never been exposed to chemicals or other toxic agents.  Allergies:  Allergies  Allergen Reactions   Bactrim [Sulfamethoxazole-Trimethoprim] Hives    Facial swelling with hives     Current Medications: Current Outpatient Medications  Medication Sig Dispense Refill   aspirin EC 81 MG tablet Take 81 mg by mouth daily. Swallow whole.     Cyanocobalamin (VITAMIN B12 PO) Take 1 tablet by mouth daily. Strength unknown     dicyclomine (BENTYL) 20 MG tablet Take 20 mg by mouth as directed.     DULoxetine (CYMBALTA) 60 MG capsule Take 60 mg by mouth daily.     HYDROcodone-acetaminophen (NORCO/VICODIN) 5-325 MG tablet Take 1 tablet by mouth daily as needed for moderate pain or severe pain.     levothyroxine (SYNTHROID) 88 MCG tablet Take 1 tablet (88 mcg total) by mouth daily before breakfast. 90 tablet 0   omeprazole (PRILOSEC) 40 MG capsule Take 40 mg by mouth daily.     pregabalin (LYRICA) 100 MG capsule Take 100 mg by mouth daily.     rosuvastatin (CRESTOR) 40 MG tablet Take 40 mg by mouth daily.     Semaglutide, 1 MG/DOSE, (OZEMPIC, 1 MG/DOSE,) 4 MG/3ML SOPN Inject 1 mg into the skin once a week.     tiZANidine (ZANAFLEX) 4 MG tablet Take 4 mg by mouth daily.     VITAMIN D PO Take 1 tablet by mouth daily. Strength unknown     No current facility-administered medications for this visit.    REVIEW OF SYSTEMS:  Review of Systems  Constitutional: Negative.  Negative for appetite change, chills, diaphoresis, fatigue, fever and unexpected  weight change.  HENT:  Negative.  Negative for hearing loss, lump/mass, mouth sores, nosebleeds, sore throat, tinnitus, trouble swallowing and voice change.   Eyes: Negative.  Negative for eye problems and icterus.  Respiratory: Negative.  Negative for chest tightness, cough, hemoptysis, shortness of breath and wheezing.   Cardiovascular: Negative.  Negative for chest pain, leg swelling and palpitations.  Gastrointestinal:  Negative for abdominal distention, abdominal pain, blood in stool, constipation, diarrhea, nausea, rectal pain and vomiting.  Endocrine:  Negative.   Genitourinary: Negative.  Negative for bladder incontinence, difficulty urinating, dyspareunia, dysuria, frequency, hematuria, menstrual problem, nocturia, pelvic pain, vaginal bleeding and vaginal discharge.        Frothy urine  Musculoskeletal:  Positive for arthralgias and myalgias. Negative for back pain, flank pain, gait problem, neck pain and neck stiffness.       Knee pain and a aching pain in her left lower thorax  Skin: Negative.  Negative for itching, rash and wound.  Neurological: Negative.  Negative for dizziness, extremity weakness, gait problem, headaches, light-headedness, numbness, seizures and speech difficulty.  Hematological: Negative.  Negative for adenopathy. Does not bruise/bleed easily.  Psychiatric/Behavioral: Negative.  Negative for confusion, decreased concentration, depression, sleep disturbance and suicidal ideas. The patient is not nervous/anxious.     VITALS:  Blood pressure 119/74, pulse 79, temperature 97.6 F (36.4 C), temperature source Oral, resp. rate 16, height 5\' 7"  (1.702 m), weight 235 lb 14.4 oz (107 kg), SpO2 98%.  Wt Readings from Last 3 Encounters:  12/10/23 235 lb 14.4 oz (107 kg)  08/10/23 221 lb (100.2 kg)  06/28/23 219 lb (99.3 kg)    Body mass index is 36.95 kg/m.  Performance status (ECOG): 1 - Symptomatic but completely ambulatory  PHYSICAL EXAM:  Physical Exam Vitals  and nursing note reviewed. Exam conducted with a chaperone present.  Constitutional:      General: She is not in acute distress.    Appearance: Normal appearance. She is normal weight. She is not ill-appearing, toxic-appearing or diaphoretic.  HENT:     Head: Normocephalic and atraumatic.     Right Ear: Tympanic membrane, ear canal and external ear normal. There is no impacted cerumen.     Left Ear: Tympanic membrane, ear canal and external ear normal. There is no impacted cerumen.     Nose: Nose normal. No congestion or rhinorrhea.     Mouth/Throat:     Mouth: Mucous membranes are moist.     Pharynx: Oropharynx is clear. No oropharyngeal exudate or posterior oropharyngeal erythema.  Eyes:     General: No scleral icterus.       Right eye: No discharge.        Left eye: No discharge.     Extraocular Movements: Extraocular movements intact.     Conjunctiva/sclera: Conjunctivae normal.     Pupils: Pupils are equal, round, and reactive to light.  Neck:     Vascular: No carotid bruit.  Cardiovascular:     Rate and Rhythm: Normal rate and regular rhythm.     Pulses: Normal pulses.     Heart sounds: Normal heart sounds. No murmur heard.    No friction rub. No gallop.  Pulmonary:     Effort: Pulmonary effort is normal. No respiratory distress.     Breath sounds: No stridor. No wheezing, rhonchi or rales.  Chest:     Chest wall: No tenderness.     Comments: Bilateral reconstructions are negative.  Abdominal:     General: Bowel sounds are normal. There is no distension.     Palpations: Abdomen is soft. There is no hepatomegaly, splenomegaly or mass.     Tenderness: There is no abdominal tenderness. There is no right CVA tenderness, left CVA tenderness, guarding or rebound.     Hernia: No hernia is present.  Musculoskeletal:        General: No swelling, tenderness, deformity or signs of injury. Normal range of motion.     Cervical back: Normal range of  motion and neck supple. No rigidity  or tenderness.     Right lower leg: No edema.     Left lower leg: No edema.  Lymphadenopathy:     Cervical: No cervical adenopathy.  Skin:    General: Skin is warm and dry.     Coloration: Skin is not jaundiced or pale.     Findings: No bruising, erythema, lesion or rash.  Neurological:     General: No focal deficit present.     Mental Status: She is alert and oriented to person, place, and time. Mental status is at baseline.     Cranial Nerves: No cranial nerve deficit.     Sensory: No sensory deficit.     Motor: No weakness.     Coordination: Coordination normal.     Gait: Gait normal.     Deep Tendon Reflexes: Reflexes normal.  Psychiatric:        Mood and Affect: Mood normal.        Behavior: Behavior normal.        Thought Content: Thought content normal.        Judgment: Judgment normal.    LABS:      Latest Ref Rng & Units 12/10/2023    4:04 PM 06/28/2023    9:50 AM 06/08/2023    3:30 PM  CBC  WBC 4.0 - 10.5 K/uL 7.9  5.7  6.7   Hemoglobin 12.0 - 15.0 g/dL 38.7  56.4  33.2   Hematocrit 36.0 - 46.0 % 42.2  41.7  40.5   Platelets 150 - 400 K/uL 279  265.0  279       Latest Ref Rng & Units 12/10/2023    4:04 PM 06/28/2023    9:50 AM 06/08/2023    3:30 PM  CMP  Glucose 70 - 99 mg/dL 81  75  951   BUN 8 - 23 mg/dL 17  13  13    Creatinine 0.44 - 1.00 mg/dL 8.84  1.66  0.63   Sodium 135 - 145 mmol/L 142  138  137   Potassium 3.5 - 5.1 mmol/L 3.5  3.8  3.6   Chloride 98 - 111 mmol/L 104  103  105   CO2 22 - 32 mmol/L 25  29  25    Calcium 8.9 - 10.3 mg/dL 8.9  9.0  8.8   Total Protein 6.5 - 8.1 g/dL 6.8  6.3  6.6   Total Bilirubin 0.0 - 1.2 mg/dL 1.1  1.2  1.2   Alkaline Phos 38 - 126 U/L 99  74  80   AST 15 - 41 U/L 20  13  17    ALT 0 - 44 U/L 11  11  15     Lab Results  Component Value Date   CEA1 2.7 12/05/2021   /  CEA  Date Value Ref Range Status  12/05/2021 2.7 0.0 - 4.7 ng/mL Final    Comment:    (NOTE)                             Nonsmokers           <3.9                             Smokers             <5.6 [employer] Electrochemiluminescence Immunoassay (ECLIA) Values obtained with different assay methods  or kits cannot be used interchangeably.  Results cannot be interpreted as absolute evidence of the presence or absence of malignant disease. Performed At: [facility] [address] [place], [place] [id number] Jolene Schimke MD ZD:[id number]    No results found for: "PSA1" No results found for: "CAN199" Lab Results  Component Value Date   CAN125 5.1 12/05/2021    No results found for: "TOTALPROTELP", "ALBUMINELP", "A1GS", "A2GS", "BETS", "BETA2SER", "GAMS", "MSPIKE", "SPEI" No results found for: "TIBC", "FERRITIN", "IRONPCTSAT" No results found for: "LDH"  STUDIES:        I,Jasmine M Lassiter,acting as a scribe for [name], MD.,have documented all relevant documentation on the behalf of [name], MD,as directed by  [name], MD while in the presence of [name], MD.

## 2023-12-12 LAB — URINE CULTURE: Culture: <10000 — AB

## 2023-12-13 ENCOUNTER — Telehealth: Payer: Self-pay | Admitting: Oncology

## 2023-12-13 NOTE — Telephone Encounter (Signed)
12/10/23 Spoke with patient and confirmed next appt.

## 2023-12-17 ENCOUNTER — Encounter: Payer: Self-pay | Admitting: "Endocrinology

## 2023-12-17 ENCOUNTER — Ambulatory Visit (INDEPENDENT_AMBULATORY_CARE_PROVIDER_SITE_OTHER): Payer: Medicare HMO | Admitting: "Endocrinology

## 2023-12-17 ENCOUNTER — Other Ambulatory Visit: Payer: Self-pay

## 2023-12-17 VITALS — BP 122/74 | HR 84 | Ht 67.0 in | Wt 235.0 lb

## 2023-12-17 DIAGNOSIS — E039 Hypothyroidism, unspecified: Secondary | ICD-10-CM

## 2023-12-17 DIAGNOSIS — E042 Nontoxic multinodular goiter: Secondary | ICD-10-CM

## 2023-12-17 NOTE — Progress Notes (Signed)
Outpatient Endocrinology Note Laura Anoka, MD  12/17/23   Laura Hill 10-Jun-1961 [id number]  Referring Provider: Buckner Malta, MD Primary Care Provider: Buckner Malta, MD Subjective  Chief Complaint  Patient presents with   Follow-up    Assessment & Plan  Laura Hill was seen today for follow-up.  Diagnoses and all orders for this visit:  Hypothyroidism, unspecified type -     TSH -     Cancel: T4, free; Future -     US THYROID; Future -     US THYROID  Multiple thyroid nodules   [name] is currently taking levothyroxine po qd. Patient is currently clinically euthyroid. Labs today. Educated on thyroid axis.  Recommend the following: Take levothyroxine every morning.  Advised to take levothyroxine first thing in the morning on empty stomach and wait at least 30 minutes to 1 hour before eating or drinking anything or taking any other medications. Space out levothyroxine by 4 hours from any acid reflux medication/fibrate/iron/calcium/multivitamin. Advised to take birth control pills and nutritional supplements in the evening. Repeat lab before next visit or sooner if symptoms of hyperthyroidism or hypothyroidism develop.  Notify us immediately in case of pregnancy/breastfeeding or significant weight gain or loss. Counseled on compliance and follow up needs.  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 Counseled patient on  symptoms, examination findings, lab findings, imaging results, treatment decisions and monitoring and prognosis. The patient understood the recommendations and agrees with the treatment plan. All questions regarding treatment plan were fully answered.   Return in about 6 weeks (around 01/28/2024) for visit, labs today.   Laura Webster, MD  12/17/23   I have reviewed current medications, nurse's notes, allergies, vital signs, past medical and surgical history, family medical history, and social history for this encounter. Counseled patient on symptoms, examination findings, lab findings, imaging results, treatment decisions and monitoring and prognosis. The patient understood the recommendations and agrees with the treatment plan. All questions regarding treatment plan were fully answered.   History of Present Illness Laura Hill is a 63 y.o. year old female who presents to our clinic with history of thyroid nodules diagnosed around 2012. Is BRCA 2+ gene +ve.    She has never been on amiodarone or lithium; she had bx of 2 nodules in approx 2017, in [place], but we don't have results; patient says it was benign. Per Dr George Hugh note, records received from [facility] were from plastic surg, not thyroid.  On levothyroxine once a day   Symptoms suggestive of HYPOTHYROIDISM:  fatigue Yes, intermittent  weight gain No cold intolerance  No constipation  Yes, has IBS  Symptoms suggestive of HYPERTHYROIDISM:  weight loss  No heat intolerance No, getting hot flashes. History of breast cancer s/p hysterectomy with B/L SPO at 46. Had chemo after hyperdefecation  Yes, has IBS palpitations  Yes, occasional. Saw cardiologist previously    Compressive symptoms:  dysphagia  No dysphonia  Yes, hsuotry of sinus  issues and surgery positional dyspnea (especially with simultaneous arms elevation)  No, use CPAP every night   Smokes  No On biotin  No Personal history of head/neck surgery/irradiation   No  Physical Exam  BP 122/74 (BP Location: Left Arm, Patient Position: Sitting, Cuff Size: Normal)   Pulse 84   Ht 5\' 7"  (1.702 m)   Wt 235 lb (106.6 kg)   SpO2 99%   BMI 36.81 kg/m  Constitutional: well developed, well nourished Head: normocephalic, atraumatic, - exophthalmos Eyes: sclera anicteric, no redness Neck: - thyromegaly, - thyroid tenderness; - nodules palpated Lungs: normal respiratory effort Neurology: alert and oriented, - fine hand tremor Skin: dry, no appreciable rashes Musculoskeletal: no appreciable defects Psychiatric: normal mood and affect  Allergies Allergies  Allergen Reactions   Bactrim [Sulfamethoxazole-Trimethoprim] Hives    Facial swelling with hives     Current Medications Patient's Medications  New Prescriptions   No medications on file  Previous Medications   ASPIRIN EC 81 MG TABLET    Take 81 mg by mouth daily. Swallow whole.   CYANOCOBALAMIN (VITAMIN B12 PO)    Take 1 tablet by mouth daily. Strength unknown   DICYCLOMINE (BENTYL) 20 MG TABLET    Take 20 mg by mouth as directed.   DULOXETINE (CYMBALTA) 60 MG CAPSULE    Take 60 mg by mouth daily.   HYDROCODONE-ACETAMINOPHEN (NORCO/VICODIN) 5-325 MG TABLET    Take 1 tablet by mouth daily as needed for moderate pain or severe pain.   LEVOTHYROXINE (SYNTHROID) 88 MCG TABLET    Take 1 tablet (88 mcg total) by mouth daily before breakfast.   OMEPRAZOLE (PRILOSEC) 40 MG CAPSULE    Take 40 mg by mouth daily.   PREGABALIN (LYRICA) 100 MG CAPSULE    Take 100 mg by mouth daily.   ROSUVASTATIN (CRESTOR) 40 MG TABLET    Take 40 mg by mouth daily.   SEMAGLUTIDE, 1 MG/DOSE, (OZEMPIC, 1 MG/DOSE,) 4 MG/3ML SOPN    Inject 1 mg into the skin once a week.   TIZANIDINE (ZANAFLEX) 4 MG TABLET    Take 4 mg by mouth daily.   VITAMIN D PO    Take 1 tablet by mouth daily. Strength unknown  Modified Medications   No medications on file  Discontinued Medications   No medications on file    Past Medical  History Past Medical History:  Diagnosis Date   B12 deficiency    Breast cancer (HCC) 2009   Double Masectomy and lymph node removal bilaterally   Chronic pain disorder    DDD (degenerative disc disease), cervical    Fibromyalgia    GERD (gastroesophageal reflux disease)    Hashimoto's disease    Hypercholesteremia    Hyperlipidemia    Irritable bowel syndrome (IBS)    Mass of soft tissue of chest 09/03/2022   Migraine, cervicogenic    Osteoporosis    Thyroid disease    Vitamin D deficiency     Past Surgical History Past Surgical History:  Procedure Laterality Date   ABDOMINAL HYSTERECTOMY  2009   BREAST RECONSTRUCTION Bilateral 2010   DILATATION AND CURETTAGE/HYSTEROSCOPY WITH MINERVA     FUNCTIONAL ENDOSCOPIC SINUS SURGERY  12/24/2022   Septoplasty Submucosal Resectioning of Inferior Turbinates   LEG SURGERY     MASTECTOMY Bilateral 2009   REPLACEMENT TOTAL KNEE Bilateral    SALPINGOOPHORECTOMY Bilateral     Family History family history includes Breast cancer (age of onset: 56) in her sister; Breast cancer (age of  onset: 69) in her paternal aunt; Breast cancer (age of onset: 41) in her paternal grandmother; Breast cancer (age of onset: 15) in her brother; Cancer (age of onset: 53) in her brother; Colon cancer (age of onset: 34) in her mother; Colon cancer (age of onset: 86) in her maternal uncle; Melanoma (age of onset: 32) in her cousin; Multiple myeloma (age of onset: 50) in her mother; Prostate cancer (age of onset: 53) in her father; Skin cancer in her maternal aunt; Stomach cancer (age of onset: 74) in her maternal uncle.  Social History Social History   Socioeconomic History   Marital status: Married    Spouse name: Maisie Fus   Number of children: 4   Years of education: 12+   Highest education level: 12th grade  Occupational History   Not on file  Tobacco Use   Smoking status: Never   Smokeless tobacco: Never  Vaping Use   Vaping status: Never Used   Substance and Sexual Activity   Alcohol use: Not Currently    Comment: rare   Drug use: Never   Sexual activity: Not Currently  Other Topics Concern   Not on file  Social History Narrative   Not on file   Social Drivers of Health   Financial Resource Strain: Not on file  Food Insecurity: Not on file  Transportation Needs: Not on file  Physical Activity: Not on file  Stress: Not on file  Social Connections: Not on file  Intimate Partner Violence: Not on file    Laboratory Investigations Lab Results  Component Value Date   TSH 0.01 (L) 03/10/2022   TSH 0.01 Repeated and verified X2. (L) 07/07/2021   FREET4 1.19 03/10/2022   FREET4 1.36 07/07/2021     No results found for: "TSI"   No components found for: "TRAB"   No results found for: "CHOL" No results found for: "HDL" No results found for: "LDLCALC" No results found for: "TRIG" No results found for: "CHOLHDL" Lab Results  Component Value Date   CREATININE 0.84 12/10/2023   Lab Results  Component Value Date   GFR 79.23 06/28/2023      Component Value Date/Time   NA 142 12/10/2023 1604   K 3.5 12/10/2023 1604   CL 104 12/10/2023 1604   CO2 25 12/10/2023 1604   GLUCOSE 81 12/10/2023 1604   BUN 17 12/10/2023 1604   CREATININE 0.84 12/10/2023 1604   CALCIUM 8.9 12/10/2023 1604   PROT 6.8 12/10/2023 1604   ALBUMIN 4.4 12/10/2023 1604   AST 20 12/10/2023 1604   ALT 11 12/10/2023 1604   ALKPHOS 99 12/10/2023 1604   BILITOT 1.1 12/10/2023 1604   GFRNONAA >60 12/10/2023 1604      Latest Ref Rng & Units 12/10/2023    4:04 PM 06/28/2023    9:50 AM 06/08/2023    3:30 PM  BMP  Glucose 70 - 99 mg/dL 81  75  161   BUN 8 - 23 mg/dL 17  13  13    Creatinine 0.44 - 1.00 mg/dL 0.96  0.45  4.09   Sodium 135 - 145 mmol/L 142  138  137   Potassium 3.5 - 5.1 mmol/L 3.5  3.8  3.6   Chloride 98 - 111 mmol/L 104  103  105   CO2 22 - 32 mmol/L 25  29  25    Calcium 8.9 - 10.3 mg/dL 8.9  9.0  8.8        Component Value  Date/Time   WBC  7.9 12/10/2023 1604   WBC 5.7 06/28/2023 0950   RBC 4.97 12/10/2023 1604   HGB 14.1 12/10/2023 1604   HCT 42.2 12/10/2023 1604   PLT 279 12/10/2023 1604   MCV 84.9 12/10/2023 1604   MCH 28.4 12/10/2023 1604   MCHC 33.4 12/10/2023 1604   RDW 14.0 12/10/2023 1604   LYMPHSABS 2.4 12/10/2023 1604   MONOABS 0.5 12/10/2023 1604   EOSABS 0.5 12/10/2023 1604   BASOSABS 0.1 12/10/2023 1604      Parts of this note may have been dictated using voice recognition software. There may be variances in spelling and vocabulary which are unintentional. Not all errors are proofread. Please notify the Thereasa Parkin if any discrepancies are noted or if the meaning of any statement is not clear.

## 2023-12-20 ENCOUNTER — Other Ambulatory Visit: Payer: Self-pay

## 2023-12-20 DIAGNOSIS — L430 Hypertrophic lichen planus: Secondary | ICD-10-CM | POA: Diagnosis not present

## 2023-12-20 DIAGNOSIS — D485 Neoplasm of uncertain behavior of skin: Secondary | ICD-10-CM | POA: Diagnosis not present

## 2023-12-20 NOTE — Progress Notes (Signed)
   12/20/2023  Patient ID: Laura Hill, female   DOB: 07/27/1961, 63 y.o.   MRN: [id number]    2025 Medication Assistance Renewal Application Summary:  Patient was outreached regarding medication assistance renewal for 2025. Verified address, anticipated insurance for 2025, and income has not changed. Patient remains interested in PAP for 2025 for Ozempic, no other new medications were identified for medication assistance.    Medication Assistance Findings:  Medication assistance needs identified: Ozempic     Additional medication assistance options reviewed with patient as warranted:  No other options identified  Plan: I will route patient assistance letter to pharmacy technician who will coordinate patient assistance program application process for medications listed above.  Pharmacy technician will assist with obtaining all required documents from both patient and provider(s) and submit application(s) once completed.    Thank you for allowing pharmacy to be a part of this patient's care.  Harlon Flor, PharmD Clinical Pharmacist  [phone]

## 2023-12-21 ENCOUNTER — Ambulatory Visit (HOSPITAL_BASED_OUTPATIENT_CLINIC_OR_DEPARTMENT_OTHER)
Admission: RE | Admit: 2023-12-21 | Discharge: 2023-12-21 | Discharge status: Home / Self Care | Payer: Medicare HMO | Source: Ambulatory Visit | Attending: "Endocrinology | Admitting: Radiology

## 2023-12-21 DIAGNOSIS — M5030 Other cervical disc degeneration, unspecified cervical region: Secondary | ICD-10-CM | POA: Diagnosis not present

## 2023-12-21 DIAGNOSIS — M47816 Spondylosis without myelopathy or radiculopathy, lumbar region: Secondary | ICD-10-CM | POA: Diagnosis not present

## 2023-12-21 DIAGNOSIS — M549 Dorsalgia, unspecified: Secondary | ICD-10-CM | POA: Diagnosis not present

## 2023-12-21 DIAGNOSIS — M542 Cervicalgia: Secondary | ICD-10-CM | POA: Diagnosis not present

## 2023-12-21 DIAGNOSIS — E039 Hypothyroidism, unspecified: Secondary | ICD-10-CM | POA: Diagnosis not present

## 2023-12-21 DIAGNOSIS — E042 Nontoxic multinodular goiter: Secondary | ICD-10-CM | POA: Diagnosis not present

## 2023-12-21 DIAGNOSIS — M797 Fibromyalgia: Secondary | ICD-10-CM | POA: Diagnosis not present

## 2023-12-21 DIAGNOSIS — G894 Chronic pain syndrome: Secondary | ICD-10-CM | POA: Diagnosis not present

## 2023-12-21 DIAGNOSIS — Z1389 Encounter for screening for other disorder: Secondary | ICD-10-CM | POA: Diagnosis not present

## 2023-12-22 ENCOUNTER — Telehealth: Payer: Self-pay | Admitting: Pharmacy Technician

## 2023-12-22 DIAGNOSIS — Z5986 Financial insecurity: Secondary | ICD-10-CM

## 2023-12-22 LAB — T4, FREE: Free T4: 1.2 ng/dL (ref 0.8–1.8)

## 2023-12-22 LAB — TEST AUTHORIZATION

## 2023-12-22 LAB — TSH: TSH: 0.31 m[IU]/L — ABNORMAL LOW (ref 0.40–4.50)

## 2023-12-22 NOTE — Progress Notes (Addendum)
 Pharmacy Medication Assistance Program Note    12/22/2023  Patient ID: Laura Hill, female   DOB: 11/10/1961, 63 y.o.   MRN: [id number]     12/22/2023  Outreach [employer]  Initial Outreach Date ([employer]) 12/21/2023  Manufacturer [employer] [facility] Ozempic  Dose of Ozempic 4mg /50ml  Type of Sport And Exercise Psychologist  Date Application Sent to Patient 12/22/2023  Application Items Requested Application;Proof of Income;Other  Date Application Sent to Prescriber 12/22/2023  Name of Prescriber Delon Contes   [facility] 01/13/2024 Successful outreach to patient. HIPAA verified. Patient informs she received the application and is working on it. Will await return of the application. ADDENDUM 02/09/2024 Unsuccessful outreach to patient. HIPAA compliant voicemail left. Was calling to inquire if she had mailed back the application as per our 01/13/24 phone call. Requested a return call. Will await return call and return application.  [name], CPhT [place]  Office: [phone] Fax: [phone] Email: [name]

## 2023-12-23 ENCOUNTER — Telehealth: Payer: Self-pay

## 2023-12-23 NOTE — Telephone Encounter (Signed)
-----   Message from [facility] sent at 12/23/2023  2:20 PM EST ----- Please let the patient know that I reviewed her thyroid  labs.  I need to help to take levothyroxine  88 mcg every day Monday through Saturday but only take half a pill on Sundays, thanks

## 2023-12-23 NOTE — Telephone Encounter (Signed)
 Patient given results and medication changes as directed by MD. No further questions at this time.

## 2024-01-21 DIAGNOSIS — E038 Other specified hypothyroidism: Secondary | ICD-10-CM | POA: Diagnosis not present

## 2024-01-21 DIAGNOSIS — C50919 Malignant neoplasm of unspecified site of unspecified female breast: Secondary | ICD-10-CM | POA: Diagnosis not present

## 2024-01-21 DIAGNOSIS — E876 Hypokalemia: Secondary | ICD-10-CM | POA: Diagnosis not present

## 2024-01-21 DIAGNOSIS — M79641 Pain in right hand: Secondary | ICD-10-CM | POA: Diagnosis not present

## 2024-01-21 DIAGNOSIS — M797 Fibromyalgia: Secondary | ICD-10-CM | POA: Diagnosis not present

## 2024-01-21 DIAGNOSIS — E063 Autoimmune thyroiditis: Secondary | ICD-10-CM | POA: Diagnosis not present

## 2024-01-21 DIAGNOSIS — M79642 Pain in left hand: Secondary | ICD-10-CM | POA: Diagnosis not present

## 2024-01-21 DIAGNOSIS — E785 Hyperlipidemia, unspecified: Secondary | ICD-10-CM | POA: Diagnosis not present

## 2024-02-04 ENCOUNTER — Ambulatory Visit: Payer: Medicare HMO | Admitting: "Endocrinology

## 2024-02-04 ENCOUNTER — Encounter: Payer: Self-pay | Admitting: "Endocrinology

## 2024-02-04 VITALS — BP 130/80 | HR 75 | Ht 67.0 in | Wt 240.0 lb

## 2024-02-04 DIAGNOSIS — E039 Hypothyroidism, unspecified: Secondary | ICD-10-CM

## 2024-02-04 DIAGNOSIS — E042 Nontoxic multinodular goiter: Secondary | ICD-10-CM

## 2024-02-04 NOTE — Progress Notes (Signed)
 Outpatient Endocrinology Note Laura Scotland, MD  02/04/24   Laura Hill 02-23-1961 [id number]  Referring Provider: Buckner Malta, MD Primary Care Provider: Buckner Malta, MD Subjective  No chief complaint on file.   Assessment & Plan  Diagnoses and all orders for this visit:  Hypothyroidism, unspecified type -     TSH(Reflex)  Multiple thyroid nodules    Laura Hill is currently taking levothyroxine po qd. Patient is currently clinically unchanged. Labs today. Educated on thyroid axis.  Recommend the following: Take levothyroxine every morning.  Advised to take levothyroxine first thing in the morning on empty stomach and wait at least 30 minutes to 1 hour before eating or drinking anything or taking any other medications. Space out levothyroxine by 4 hours from any acid reflux medication/fibrate/iron/calcium/multivitamin. Advised to take nutritional supplements in the evening. Repeat lab before next visit or sooner if symptoms of hyperthyroidism or hypothyroidism develop.  Notify us immediately in case of significant weight gain or loss. Counseled on compliance and follow up needs.  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 Counseled patient on symptoms, examination findings, lab findings, imaging results, treatment decisions and monitoring and prognosis. The patient understood the recommendations and  agrees with the treatment plan. All questions regarding treatment plan were fully answered.   Return in about 4 months (around 06/05/2024) for visit + labs before next visit, labs today.   Laura [place], MD  02/04/24   I have reviewed current medications, nurse's notes, allergies, vital signs, past medical and surgical history, family medical history, and social history for this encounter. Counseled patient on symptoms, examination findings, lab findings, imaging results, treatment decisions and monitoring and prognosis. The patient understood the recommendations and agrees with the treatment plan. All questions regarding treatment plan were fully answered.   History of Present Illness Laura Hill is a 63 y.o. year old female who presents for follow up to our clinic with history of thyroid nodules diagnosed around 2012. Is BRCA 2+ gene +ve.    She has never been on amiodarone or lithium; she had bx of 2 nodules in approx 2017, in [place], but we don't have results; patient says it was benign. Per Dr George Hugh note, records received from [facility] were from plastic surg, not thyroid.  On levothyroxine once a day   Symptoms suggestive of HYPOTHYROIDISM:  fatigue Yes, intermittent  weight gain No cold intolerance  No constipation  Yes, has IBS  Symptoms suggestive of HYPERTHYROIDISM:  weight loss  Yes, intentional  heat intolerance No, getting hot flashes. History of breast cancer s/p hysterectomy with B/L SPO at 46. Had chemo after hyperdefecation  Yes, has IBS palpitations  Yes, occasional. Saw cardiologist previously    Compressive symptoms:  dysphagia  No dysphonia  Yes, history of sinus issues and surgery positional dyspnea (especially with simultaneous arms elevation)  No, use CPAP every night   Smokes  No On biotin  No Personal history  of head/neck surgery/irradiation  No  Thyroid nodule follow-up   EXAM: THYROID ULTRASOUND   TECHNIQUE: Ultrasound examination of the  thyroid gland and adjacent soft tissues was performed.   COMPARISON:  None available.   FINDINGS: Parenchymal Echotexture: Moderately heterogenous   Isthmus: 0.4 cm   Right lobe: 3.7 x 1.5 x 1.5 cm   Left lobe: 4.2 x 1.7 x 1.6 cm   _________________________________________________________   Estimated total number of nodules >/= 1 cm: 4   Number of spongiform nodules >/=  2 cm not described below (TR1): 0   Number of mixed cystic and solid nodules >/= 1.5 cm not described below (TR2): 0   _________________________________________________________   Nodule 1: 1.4 x 0.5 x 1.1 cm focally heterogeneous region within the isthmus favored to be pseudonodule.   _________________________________________________________   Nodule 2: 1.2 x 0.8 x 1.2 cm focally heterogeneous region in the right superior thyroid lobe favored to be a pseudonodule.   _________________________________________________________   Nodule 3: 0.7 x 0.6 x 0.6 cm right mid thyroid nodule does not meet criteria for imaging surveillance or FNA.   _________________________________________________________   Nodule 4: 0.9 x 0.7 x 0.5 cm solid hypoechoic left superior thyroid nodule does not meet criteria for imaging surveillance or FNA.   _________________________________________________________   Nodule 5: 1.0 x 0.8 x 0.5 cm focally heterogeneous region in the left mid thyroid lobe favored to be a pseudonodule.   _________________________________________________________   Nodule 6: 1.0 x 0.8 x 0.7 cm heterogeneous region in the inferior left thyroid lobe favored to be a pseudonodule.   IMPRESSION: Marked diffuse heterogeneity of the thyroid with numerous pseudonodules. No nodules meeting criteria for FNA or imaging follow-up.    Physical Exam  BP 130/80   Pulse 75   Ht 5\' 7"  (1.702 m)   Wt 240 lb (108.9 kg)   SpO2 97%   BMI 37.59 kg/m  Constitutional: well developed, well nourished Head:  normocephalic, atraumatic, - exophthalmos Eyes: sclera anicteric, no redness Neck: - thyromegaly, - thyroid tenderness; - nodules palpated Lungs: normal respiratory effort Neurology: alert and oriented, - fine hand tremor Skin: dry, no appreciable rashes Musculoskeletal: no appreciable defects Psychiatric: normal mood and affect  Allergies Allergies  Allergen Reactions   Bactrim [Sulfamethoxazole-Trimethoprim] Hives    Facial swelling with hives     Current Medications Patient's Medications  New Prescriptions   No medications on file  Previous Medications   ASPIRIN EC 81 MG TABLET    Take 81 mg by mouth daily. Swallow whole.   CYANOCOBALAMIN (VITAMIN B12 PO)    Take 1 tablet by mouth daily. Strength unknown   DICYCLOMINE (BENTYL) 20 MG TABLET    Take 20 mg by mouth as directed.   DULOXETINE (CYMBALTA) 60 MG CAPSULE    Take 60 mg by mouth daily.   HYDROCODONE-ACETAMINOPHEN (NORCO/VICODIN) 5-325 MG TABLET    Take 1 tablet by mouth daily as needed for moderate pain or severe pain.   LEVOTHYROXINE (SYNTHROID) 88 MCG TABLET    Take 1 tablet (88 mcg total) by mouth daily before breakfast.   OMEPRAZOLE (PRILOSEC) 40 MG CAPSULE    Take 40 mg by mouth daily.   PREGABALIN (LYRICA) 100 MG CAPSULE    Take 100 mg by mouth daily.   ROSUVASTATIN (CRESTOR) 40 MG TABLET    Take 40 mg by mouth daily.   SEMAGLUTIDE, 1 MG/DOSE, (OZEMPIC, 1 MG/DOSE,) 4 MG/3ML SOPN    Inject 1 mg into the skin once a  week.   TIZANIDINE (ZANAFLEX) 4 MG TABLET    Take 4 mg by mouth daily.   VITAMIN D PO    Take 1 tablet by mouth daily. Strength unknown  Modified Medications   No medications on file  Discontinued Medications   No medications on file    Past Medical History Past Medical History:  Diagnosis Date   B12 deficiency    Breast cancer (HCC) 2009   Double Masectomy and lymph node removal bilaterally   Chronic pain disorder    DDD (degenerative disc disease), cervical    Fibromyalgia    GERD  (gastroesophageal reflux disease)    Hashimoto's disease    Hypercholesteremia    Hyperlipidemia    Irritable bowel syndrome (IBS)    Mass of soft tissue of chest 09/03/2022   Migraine, cervicogenic    Osteoporosis    Thyroid disease    Vitamin D deficiency     Past Surgical History Past Surgical History:  Procedure Laterality Date   ABDOMINAL HYSTERECTOMY  2009   BREAST RECONSTRUCTION Bilateral 2010   DILATATION AND CURETTAGE/HYSTEROSCOPY WITH MINERVA     FUNCTIONAL ENDOSCOPIC SINUS SURGERY  12/24/2022   Septoplasty Submucosal Resectioning of Inferior Turbinates   LEG SURGERY     MASTECTOMY Bilateral 2009   REPLACEMENT TOTAL KNEE Bilateral    SALPINGOOPHORECTOMY Bilateral     Family History family history includes Breast cancer (age of onset: 84) in her sister; Breast cancer (age of onset: 33) in her paternal aunt; Breast cancer (age of onset: 21) in her paternal grandmother; Breast cancer (age of onset: 7) in her brother; Cancer (age of onset: 76) in her brother; Colon cancer (age of onset: 74) in her mother; Colon cancer (age of onset: 65) in her maternal uncle; Melanoma (age of onset: 32) in her cousin; Multiple myeloma (age of onset: 43) in her mother; Prostate cancer (age of onset: 82) in her father; Skin cancer in her maternal aunt; Stomach cancer (age of onset: 36) in her maternal uncle.  Social History Social History   Socioeconomic History   Marital status: Married    Spouse name: Maisie Fus   Number of children: 4   Years of education: 12+   Highest education level: 12th grade  Occupational History   Not on file  Tobacco Use   Smoking status: Never   Smokeless tobacco: Never  Vaping Use   Vaping status: Never Used  Substance and Sexual Activity   Alcohol use: Not Currently    Comment: rare   Drug use: Never   Sexual activity: Not Currently  Other Topics Concern   Not on file  Social History Narrative   Not on file   Social Drivers of Health   Financial  Resource Strain: Not on file  Food Insecurity: Not on file  Transportation Needs: Not on file  Physical Activity: Not on file  Stress: Not on file  Social Connections: Not on file  Intimate Partner Violence: Not on file    Laboratory Investigations Lab Results  Component Value Date   TSH 0.31 (L) 12/17/2023   TSH 0.01 (L) 03/10/2022   TSH 0.01 Repeated and verified X2. (L) 07/07/2021   FREET4 1.2 12/17/2023   FREET4 1.19 03/10/2022   FREET4 1.36 07/07/2021     No results found for: "TSI"   No components found for: "TRAB"   No results found for: "CHOL" No results found for: "HDL" No results found for: "LDLCALC" No results found for: "TRIG" No results found  for: "CHOLHDL" Lab Results  Component Value Date   CREATININE 0.84 12/10/2023   Lab Results  Component Value Date   GFR 79.23 06/28/2023      Component Value Date/Time   NA 142 12/10/2023 1604   K 3.5 12/10/2023 1604   CL 104 12/10/2023 1604   CO2 25 12/10/2023 1604   GLUCOSE 81 12/10/2023 1604   BUN 17 12/10/2023 1604   CREATININE 0.84 12/10/2023 1604   CALCIUM 8.9 12/10/2023 1604   PROT 6.8 12/10/2023 1604   ALBUMIN 4.4 12/10/2023 1604   AST 20 12/10/2023 1604   ALT 11 12/10/2023 1604   ALKPHOS 99 12/10/2023 1604   BILITOT 1.1 12/10/2023 1604   GFRNONAA >60 12/10/2023 1604      Latest Ref Rng & Units 12/10/2023    4:04 PM 06/28/2023    9:50 AM 06/08/2023    3:30 PM  BMP  Glucose 70 - 99 mg/dL 81  75  811   BUN 8 - 23 mg/dL 17  13  13    Creatinine 0.44 - 1.00 mg/dL 9.14  7.82  9.56   Sodium 135 - 145 mmol/L 142  138  137   Potassium 3.5 - 5.1 mmol/L 3.5  3.8  3.6   Chloride 98 - 111 mmol/L 104  103  105   CO2 22 - 32 mmol/L 25  29  25    Calcium 8.9 - 10.3 mg/dL 8.9  9.0  8.8        Component Value Date/Time   WBC 7.9 12/10/2023 1604   WBC 5.7 06/28/2023 0950   RBC 4.97 12/10/2023 1604   HGB 14.1 12/10/2023 1604   HCT 42.2 12/10/2023 1604   PLT 279 12/10/2023 1604   MCV 84.9 12/10/2023 1604    MCH 28.4 12/10/2023 1604   MCHC 33.4 12/10/2023 1604   RDW 14.0 12/10/2023 1604   LYMPHSABS 2.4 12/10/2023 1604   MONOABS 0.5 12/10/2023 1604   EOSABS 0.5 12/10/2023 1604   BASOSABS 0.1 12/10/2023 1604      Parts of this note may have been dictated using voice recognition software. There may be variances in spelling and vocabulary which are unintentional. Not all errors are proofread. Please notify the Thereasa Parkin if any discrepancies are noted or if the meaning of any statement is not clear.

## 2024-02-07 ENCOUNTER — Other Ambulatory Visit: Payer: Self-pay | Admitting: "Endocrinology

## 2024-02-07 DIAGNOSIS — E039 Hypothyroidism, unspecified: Secondary | ICD-10-CM

## 2024-02-07 LAB — REFLEX TIQ

## 2024-02-07 LAB — TSH(REFL): TSH: 0.76 m[IU]/L (ref 0.40–4.50)

## 2024-02-21 DIAGNOSIS — Z1389 Encounter for screening for other disorder: Secondary | ICD-10-CM | POA: Diagnosis not present

## 2024-02-21 DIAGNOSIS — M549 Dorsalgia, unspecified: Secondary | ICD-10-CM | POA: Diagnosis not present

## 2024-02-21 DIAGNOSIS — M5030 Other cervical disc degeneration, unspecified cervical region: Secondary | ICD-10-CM | POA: Diagnosis not present

## 2024-02-21 DIAGNOSIS — M542 Cervicalgia: Secondary | ICD-10-CM | POA: Diagnosis not present

## 2024-02-21 DIAGNOSIS — G894 Chronic pain syndrome: Secondary | ICD-10-CM | POA: Diagnosis not present

## 2024-02-21 DIAGNOSIS — M47816 Spondylosis without myelopathy or radiculopathy, lumbar region: Secondary | ICD-10-CM | POA: Diagnosis not present

## 2024-02-21 DIAGNOSIS — M797 Fibromyalgia: Secondary | ICD-10-CM | POA: Diagnosis not present

## 2024-03-10 DIAGNOSIS — Z96652 Presence of left artificial knee joint: Secondary | ICD-10-CM | POA: Diagnosis not present

## 2024-03-10 DIAGNOSIS — Z96653 Presence of artificial knee joint, bilateral: Secondary | ICD-10-CM | POA: Diagnosis not present

## 2024-03-10 DIAGNOSIS — Z471 Aftercare following joint replacement surgery: Secondary | ICD-10-CM | POA: Diagnosis not present

## 2024-03-10 DIAGNOSIS — M25562 Pain in left knee: Secondary | ICD-10-CM | POA: Diagnosis not present

## 2024-03-10 DIAGNOSIS — Z96651 Presence of right artificial knee joint: Secondary | ICD-10-CM | POA: Diagnosis not present

## 2024-04-07 DIAGNOSIS — M5442 Lumbago with sciatica, left side: Secondary | ICD-10-CM | POA: Diagnosis not present

## 2024-04-07 DIAGNOSIS — M542 Cervicalgia: Secondary | ICD-10-CM | POA: Diagnosis not present

## 2024-04-07 DIAGNOSIS — M47816 Spondylosis without myelopathy or radiculopathy, lumbar region: Secondary | ICD-10-CM | POA: Diagnosis not present

## 2024-04-07 DIAGNOSIS — G8929 Other chronic pain: Secondary | ICD-10-CM | POA: Diagnosis not present

## 2024-04-12 DIAGNOSIS — M47816 Spondylosis without myelopathy or radiculopathy, lumbar region: Secondary | ICD-10-CM | POA: Diagnosis not present

## 2024-04-19 DIAGNOSIS — Z1339 Encounter for screening examination for other mental health and behavioral disorders: Secondary | ICD-10-CM | POA: Diagnosis not present

## 2024-04-19 DIAGNOSIS — E063 Autoimmune thyroiditis: Secondary | ICD-10-CM | POA: Diagnosis not present

## 2024-04-19 DIAGNOSIS — R7303 Prediabetes: Secondary | ICD-10-CM | POA: Diagnosis not present

## 2024-04-19 DIAGNOSIS — Z0000 Encounter for general adult medical examination without abnormal findings: Secondary | ICD-10-CM | POA: Diagnosis not present

## 2024-04-19 DIAGNOSIS — E038 Other specified hypothyroidism: Secondary | ICD-10-CM | POA: Diagnosis not present

## 2024-04-19 DIAGNOSIS — C50919 Malignant neoplasm of unspecified site of unspecified female breast: Secondary | ICD-10-CM | POA: Diagnosis not present

## 2024-04-19 DIAGNOSIS — E538 Deficiency of other specified B group vitamins: Secondary | ICD-10-CM | POA: Diagnosis not present

## 2024-04-19 DIAGNOSIS — E559 Vitamin D deficiency, unspecified: Secondary | ICD-10-CM | POA: Diagnosis not present

## 2024-04-19 DIAGNOSIS — E785 Hyperlipidemia, unspecified: Secondary | ICD-10-CM | POA: Diagnosis not present

## 2024-04-21 DIAGNOSIS — M797 Fibromyalgia: Secondary | ICD-10-CM | POA: Diagnosis not present

## 2024-04-21 DIAGNOSIS — M542 Cervicalgia: Secondary | ICD-10-CM | POA: Diagnosis not present

## 2024-06-08 ENCOUNTER — Inpatient Hospital Stay: Payer: Medicare HMO

## 2024-06-08 ENCOUNTER — Inpatient Hospital Stay: Payer: Medicare HMO | Admitting: Oncology

## 2024-06-16 ENCOUNTER — Ambulatory Visit (INDEPENDENT_AMBULATORY_CARE_PROVIDER_SITE_OTHER): Admitting: "Endocrinology

## 2024-06-16 ENCOUNTER — Encounter: Payer: Self-pay | Admitting: "Endocrinology

## 2024-06-16 VITALS — BP 122/80 | HR 82 | Ht 67.0 in | Wt 244.0 lb

## 2024-06-16 DIAGNOSIS — E039 Hypothyroidism, unspecified: Secondary | ICD-10-CM | POA: Diagnosis not present

## 2024-06-16 DIAGNOSIS — E042 Nontoxic multinodular goiter: Secondary | ICD-10-CM

## 2024-06-16 LAB — TSH+FREE T4: TSH W/REFLEX TO FT4: 0.8 m[IU]/L (ref 0.40–4.50)

## 2024-06-16 NOTE — Progress Notes (Signed)
 Outpatient Endocrinology Note Laura Birmingham, MD  06/16/24   Laura Hill December 26, 1960 [id number]  Referring Provider: Clemmie Nest, MD Primary Care Provider: Clemmie Nest, MD Subjective  No chief complaint on file.   Assessment & Plan  Diagnoses and all orders for this visit:  Hypothyroidism, unspecified type -     TSH + free T4 -     TSH + free T4  Multiple thyroid  nodules   Laura Hill is currently taking levothyroxine  88mcg po qd. Patient is currently clinically unchanged. Labs today. Educated on thyroid  axis.  Recommend the following: Take levothyroxine  88mcg every morning.  Advised to take levothyroxine  first thing in the morning on empty stomach and wait at least 30 minutes to 1 hour before eating or drinking anything or taking any other medications. Space out levothyroxine  by 4 hours from any acid reflux medication/fibrate/iron/calcium/multivitamin. Advised to take nutritional supplements in the evening. Repeat lab before next visit or sooner if symptoms of hyperthyroidism or hypothyroidism develop.  Notify us  immediately in case of significant weight gain or loss. Counseled on compliance and follow up needs.  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 Counseled patient on symptoms, examination findings, lab findings, imaging results, treatment decisions and monitoring and prognosis. The patient understood the  recommendations and agrees with the treatment plan. All questions regarding treatment plan were fully answered.   Return in about 6 months (around 12/17/2024) for visit + labs before next visit, labs today.   Laura Birmingham, MD  06/16/24   I have reviewed current medications, nurse's notes, allergies, vital signs, past medical and surgical history, family medical history, and social history for this encounter. Counseled patient on symptoms, examination findings, lab findings, imaging results, treatment decisions and monitoring and prognosis. The patient understood the recommendations and agrees with the treatment plan. All questions regarding treatment plan were fully answered.   History of Present Illness Laura Hill is a 63 y.o. year old female who presents for follow up to our clinic with history of thyroid  nodules diagnosed around 2012. Is BRCA 2+ gene +ve.    She has never been on amiodarone or lithium; she had bx of 2 nodules in approx 2017, in [place], but we don't have results; patient says it was benign. Per Dr Laymond note, records received from [facility] were from plastic surg, not thyroid .  On levothyroxine  88mcg once a day   Symptoms suggestive of HYPOTHYROIDISM:  fatigue Yes, intermittent  weight gain No cold intolerance  No constipation  Yes, has IBS  Symptoms suggestive of HYPERTHYROIDISM:  weight loss  Yes, intentional  heat intolerance No, getting hot flashes. History of breast cancer s/p hysterectomy with B/L SPO at 46. Had chemo after hyperdefecation  Yes, has IBS palpitations  Yes, occasional. Saw cardiologist previously    Compressive symptoms:  dysphagia  No dysphonia  Yes, history of sinus issues and surgery positional dyspnea (especially with simultaneous arms elevation)  No, use CPAP every night  Smokes  No On biotin  No Personal history of head/neck surgery/irradiation  No  Thyroid  nodule follow-up   EXAM: THYROID  ULTRASOUND   TECHNIQUE: Ultrasound  examination of the thyroid  gland and adjacent soft tissues was performed.   COMPARISON:  None available.   FINDINGS: Parenchymal Echotexture: Moderately heterogenous   Isthmus: 0.4 cm   Right lobe: 3.7 x 1.5 x 1.5 cm   Left lobe: 4.2 x 1.7 x 1.6 cm   _________________________________________________________   Estimated total number of nodules >/= 1 cm: 4   Number of spongiform nodules >/=  2 cm not described below (TR1): 0   Number of mixed cystic and solid nodules >/= 1.5 cm not described below (TR2): 0   _________________________________________________________   Nodule 1: 1.4 x 0.5 x 1.1 cm focally heterogeneous region within the isthmus favored to be pseudonodule.   _________________________________________________________   Nodule 2: 1.2 x 0.8 x 1.2 cm focally heterogeneous region in the right superior thyroid  lobe favored to be a pseudonodule.   _________________________________________________________   Nodule 3: 0.7 x 0.6 x 0.6 cm right mid thyroid  nodule does not meet criteria for imaging surveillance or FNA.   _________________________________________________________   Nodule 4: 0.9 x 0.7 x 0.5 cm solid hypoechoic left superior thyroid  nodule does not meet criteria for imaging surveillance or FNA.   _________________________________________________________   Nodule 5: 1.0 x 0.8 x 0.5 cm focally heterogeneous region in the left mid thyroid  lobe favored to be a pseudonodule.   _________________________________________________________   Nodule 6: 1.0 x 0.8 x 0.7 cm heterogeneous region in the inferior left thyroid  lobe favored to be a pseudonodule.   IMPRESSION: Marked diffuse heterogeneity of the thyroid  with numerous pseudonodules. No nodules meeting criteria for FNA or imaging follow-up.    Physical Exam  BP 122/80   Pulse 82   Ht 5' 7 (1.702 m)   Wt 244 lb (110.7 kg)   SpO2 97%   BMI 38.22 kg/m  Constitutional: well developed, well  nourished Head: normocephalic, atraumatic, - exophthalmos Eyes: sclera anicteric, no redness Neck: - thyromegaly, - thyroid  tenderness; - nodules palpated Lungs: normal respiratory effort Neurology: alert and oriented, - fine hand tremor Skin: dry, no appreciable rashes Musculoskeletal: no appreciable defects Psychiatric: normal mood and affect  Allergies Allergies  Allergen Reactions   Bactrim [Sulfamethoxazole-Trimethoprim] Hives    Facial swelling with hives     Current Medications Patient's Medications  New Prescriptions   No medications on file  Previous Medications   ASPIRIN EC 81 MG TABLET    Take 81 mg by mouth daily. Swallow whole.   CYANOCOBALAMIN  (VITAMIN B12 PO)    Take 1 tablet by mouth daily. Strength unknown   DICYCLOMINE (BENTYL) 20 MG TABLET    Take 20 mg by mouth as directed.   DULOXETINE (CYMBALTA) 60 MG CAPSULE    Take 60 mg by mouth daily.   HYDROCODONE-ACETAMINOPHEN  (NORCO/VICODIN) 5-325 MG TABLET    Take 1 tablet by mouth daily as needed for moderate pain or severe pain.   LEVOTHYROXINE  (SYNTHROID ) 88 MCG TABLET    Take 1 tablet (88 mcg total) by mouth daily before breakfast.   OMEPRAZOLE (PRILOSEC) 40 MG CAPSULE    Take 40 mg by mouth daily.   PREGABALIN (LYRICA) 100 MG CAPSULE    Take 100 mg by mouth daily.   ROSUVASTATIN (CRESTOR) 40 MG TABLET    Take 40 mg by mouth daily.   SEMAGLUTIDE, 1 MG/DOSE, (OZEMPIC, 1 MG/DOSE,) 4 MG/3ML SOPN  Inject 1 mg into the skin once a week.   TIZANIDINE (ZANAFLEX) 4 MG TABLET    Take 4 mg by mouth daily.   VITAMIN D PO    Take 1 tablet by mouth daily. Strength unknown  Modified Medications   No medications on file  Discontinued Medications   No medications on file    Past Medical History Past Medical History:  Diagnosis Date   B12 deficiency    Breast cancer (HCC) 2009   Double Masectomy and lymph node removal bilaterally   Chronic pain disorder    DDD (degenerative disc disease), cervical    Fibromyalgia     GERD (gastroesophageal reflux disease)    Hashimoto's disease    Hypercholesteremia    Hyperlipidemia    Irritable bowel syndrome (IBS)    Mass of soft tissue of chest 09/03/2022   Migraine, cervicogenic    Osteoporosis    Thyroid  disease    Vitamin D deficiency     Past Surgical History Past Surgical History:  Procedure Laterality Date   ABDOMINAL HYSTERECTOMY  2009   BREAST RECONSTRUCTION Bilateral 2010   DILATATION AND CURETTAGE/HYSTEROSCOPY WITH MINERVA     FUNCTIONAL ENDOSCOPIC SINUS SURGERY  12/24/2022   Septoplasty Submucosal Resectioning of Inferior Turbinates   LEG SURGERY     MASTECTOMY Bilateral 2009   REPLACEMENT TOTAL KNEE Bilateral    SALPINGOOPHORECTOMY Bilateral     Family History family history includes Breast cancer (age of onset: 43) in her sister; Breast cancer (age of onset: 73) in her paternal aunt; Breast cancer (age of onset: 71) in her paternal grandmother; Breast cancer (age of onset: 30) in her brother; Cancer (age of onset: 31) in her brother; Colon cancer (age of onset: 66) in her mother; Colon cancer (age of onset: 73) in her maternal uncle; Melanoma (age of onset: 42) in her cousin; Multiple myeloma (age of onset: 49) in her mother; Prostate cancer (age of onset: 84) in her father; Skin cancer in her maternal aunt; Stomach cancer (age of onset: 68) in her maternal uncle.  Social History Social History   Socioeconomic History   Marital status: Married    Spouse name: Debby   Number of children: 4   Years of education: 12+   Highest education level: 12th grade  Occupational History   Not on file  Tobacco Use   Smoking status: Never   Smokeless tobacco: Never  Vaping Use   Vaping status: Never Used  Substance and Sexual Activity   Alcohol use: Not Currently    Comment: rare   Drug use: Never   Sexual activity: Not Currently  Other Topics Concern   Not on file  Social History Narrative   Not on file   Social Drivers of Health    Financial Resource Strain: Not on file  Food Insecurity: Not on file  Transportation Needs: Not on file  Physical Activity: Not on file  Stress: Not on file  Social Connections: Not on file  Intimate Partner Violence: Not on file    Laboratory Investigations Lab Results  Component Value Date   TSH 0.31 (L) 12/17/2023   TSH 0.01 (L) 03/10/2022   TSH 0.01 Repeated and verified X2. (L) 07/07/2021   FREET4 1.2 12/17/2023   FREET4 1.19 03/10/2022   FREET4 1.36 07/07/2021     No results found for: TSI   No components found for: TRAB   No results found for: CHOL No results found for: HDL No results found for: [facility]  No results found for: TRIG No results found for: [facility] Lab Results  Component Value Date   CREATININE 0.84 12/10/2023   Lab Results  Component Value Date   GFR 79.23 06/28/2023      Component Value Date/Time   NA 142 12/10/2023 1604   K 3.5 12/10/2023 1604   CL 104 12/10/2023 1604   CO2 25 12/10/2023 1604   GLUCOSE 81 12/10/2023 1604   BUN 17 12/10/2023 1604   CREATININE 0.84 12/10/2023 1604   CALCIUM 8.9 12/10/2023 1604   PROT 6.8 12/10/2023 1604   ALBUMIN 4.4 12/10/2023 1604   AST 20 12/10/2023 1604   ALT 11 12/10/2023 1604   ALKPHOS 99 12/10/2023 1604   BILITOT 1.1 12/10/2023 1604   GFRNONAA >60 12/10/2023 1604      Latest Ref Rng & Units 12/10/2023    4:04 PM 06/28/2023    9:50 AM 06/08/2023    3:30 PM  BMP  Glucose 70 - 99 mg/dL 81  75  881   BUN 8 - 23 mg/dL 17  13  13    Creatinine 0.44 - 1.00 mg/dL 9.15  9.19  9.16   Sodium 135 - 145 mmol/L 142  138  137   Potassium 3.5 - 5.1 mmol/L 3.5  3.8  3.6   Chloride 98 - 111 mmol/L 104  103  105   CO2 22 - 32 mmol/L 25  29  25    Calcium 8.9 - 10.3 mg/dL 8.9  9.0  8.8        Component Value Date/Time   WBC 7.9 12/10/2023 1604   WBC 5.7 06/28/2023 0950   RBC 4.97 12/10/2023 1604   HGB 14.1 12/10/2023 1604   HCT 42.2 12/10/2023 1604   PLT 279 12/10/2023 1604   MCV 84.9  12/10/2023 1604   MCH 28.4 12/10/2023 1604   MCHC 33.4 12/10/2023 1604   RDW 14.0 12/10/2023 1604   LYMPHSABS 2.4 12/10/2023 1604   MONOABS 0.5 12/10/2023 1604   EOSABS 0.5 12/10/2023 1604   BASOSABS 0.1 12/10/2023 1604      Parts of this note may have been dictated using voice recognition software. There may be variances in spelling and vocabulary which are unintentional. Not all errors are proofread. Please notify the [facility]ino if any discrepancies are noted or if the meaning of any statement is not clear.

## 2024-06-20 ENCOUNTER — Other Ambulatory Visit: Payer: Self-pay | Admitting: Oncology

## 2024-06-20 DIAGNOSIS — C50011 Malignant neoplasm of nipple and areola, right female breast: Secondary | ICD-10-CM

## 2024-06-20 NOTE — Progress Notes (Signed)
 [name]  [address] [place],  [place]  [address] [phone]  Clinic Day: 06/21/24  Referring physician: Clemmie Nest, MD  ASSESSMENT & PLAN:  Assessment: History of bilateral stage IIA breast cancers, (T2 N0 M0) on the right and (T1 N1 M0) on the left, diagnosed in 2009. 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 She received dose dense AC followed by weekly Taxol as well as endocrine therapy for a total of 10 years. She has undergone bilateral mastectomies with reconstruction. She remains without evidence of  recurrence.  BRCA2 mutation. Therefore, she has undergone bilateral mastectomies as well as hysterectomy and bilateral salpingo oophorectomy.  History of colon cancer and pancreatic cancer in the family. She did have a colonoscopy and EGD in 2023 and the colon polyp of the transverse colon was hyperplastic. Dr. Charlanne has recommended repeat in 3-5 years. He also recommends periodic abdominal scans to monitor the pancreas since her brother died of pancreatic cancer last year.   4. History of prior stroke. CT of her brain revealed a previous stroke and she was placed on Aspirin 81mg  daily.   5. Right neck node which was swollen and itchy. I really don't palpate anything today but we will check this with an ultrasound of the soft tissue neck.   Plan: She informed me that a couple weeks ago the right lymph node in her neck became swollen and itchy, this resolved on its on until about a week ago, when it happened again. She also notes developing a itchy bumpy rash on her right upper thigh for 3-4 days that has since resolved. I will schedule an ultrasound of the soft tissue neck and call her with those results She has a WBC of 7.0, hemoglobin of 13.7, and platelet count of 262,000. Her CMP is completely normal. Her last colonoscopy was done in 2023 and she was recommended a follow-up in 5 years. Her PCP does routine labs every 6 months. I  will see her back in 6 months with CBC and CMP. She understands and agrees with this plan of care. I have answered her questions and she knows to call with any concerns.   I provided 11 minutes of face-to-face time during this this encounter and > 50% was spent counseling as documented under my assessment and plan.   [name], MD [facility] A DEPT OF [facility] [address] [place] [address] Dept: [phone] Dept Fax: [phone]   No orders of the defined types were placed in this encounter.  CHIEF COMPLAINT:  CC: History of bilateral stage IIA breast cancers; BRCA2 positive  Current Treatment:  Surveillance  HISTORY OF PRESENT ILLNESS:  Laura Hill is a 63 y.o. female who presents as a transfer of care for the continued evaluation and management due to her history of bilateral stage IIA breast cancers, (T2 N0 M0) on the right and (T1 N1 M0) on the left. 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 This was originally diagnosed in 2009. She did receive dose dense AC followed by 12 weeks of weekly Taxol. Due to her BRCA2 positivity, she underwent bilateral mastectomies as well as hysterectomy and bilateral salpingo oophorectomy. She also received endocrine therapy with tamoxifen for 3 years followed by 7 years of letrozole. She did receive Boneva for osteopenia while on letrozole, and most recent bone density from May 2022 was normal.  She did undergo bilateral breast reconstructions in 2010.  Lab evaluation from December 2022 revealed a normal CBC and CMP was unremarkable. 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 There is a tiny hypodense lesion of the left lobe of the  liver, which is too small to accurately characterize, but statistically likely to reflect cyst. I reviewed the images with the patient and have difficulty even seeing this lesion. MRI pelvis from December 23rd revealed sigmoid diverticulosis, without evidence of active diverticulitis. No definite evidence of colovaginal fistula. She lost her brother to pancreatic cancer in 2024. I scheduled a CT scan of abdomen and pelvis in January 2024. This revealed pancreatic atrophy but no evidence of malignancy.   PAST MEDICAL HISTORY  I have reviewed her chart and materials related to her cancer extensively and collaborated history with the patient. 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 node, left (HCC)  11/22/2007 Initial Diagnosis   Breast cancer 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 lymph node, left (HCC)   07/25/2008 Cancer Staging   Staging form: Breast, AJCC 6th Edition - Clinical stage from 07/25/2008: Stage IIA (T1, N1, M0) - Signed by Cornelius Wanda DEL, MD on 12/15/2021 Staged by: Staging assigned at another facility Diagnostic confirmation: Positive histology Specimen type: Excision Laterality: Left Total positive nodes: 2 Stage prefix: Initial diagnosis Staging comments: Adjuvant DD AC followed by Taxol, tamoxifen x 3 years, Letrozole x 7 yrs   See summary above for Oncology history   INTERVAL HISTORY:  Laura Hill is here today for a follow up for her history of bilateral stage IIA breast cancers; BRCA2  positive. She also has a history of esophageal stricture that occasionally requires dilatation. She does have Hashimoto's disease and fibromyalgia and is on observation only. Patient states that she feels well but complains of knee and lower back pain. She informed me that a couple weeks ago the right lymph node in her neck became swollen and itchy, this resolved on its on until about a week ago, when it happened again. She also notes developing a itchy bumpy rash on her right upper thigh for 3-4 days that has since resolved. I will schedule an ultrasound of the soft tissue neck and call her with those results She has a WBC of 7.0, hemoglobin of 13.7, and platelet count of 262,000. Her CMP is completely normal. Her last colonoscopy was done in 2023 and was recommended a follow-up in 5 years.Her PCP does routine labs every 6 months. I will see her back in 6 months with CBC and CMP. She denies fever, chills, night sweats, or other signs of infection. She denies cardiac, respiratory or gastrointestinal issues. She  denies pain. Her appetite is poor and Her weight has increased 6 pounds over last 6 months.  HISTORY:   Past Medical History:  Diagnosis Date   B12 deficiency    Breast cancer (  HCC) 2009   Double Masectomy and lymph node removal bilaterally   Chronic pain disorder    DDD (degenerative disc disease), cervical    Fibromyalgia    GERD (gastroesophageal reflux disease)    Hashimoto's disease    Hypercholesteremia    Hyperlipidemia    Irritable bowel syndrome (IBS)    Mass of soft tissue of chest 09/03/2022   Migraine, cervicogenic    Osteoporosis    Thyroid  disease    Vitamin D deficiency     Past Surgical History:  Procedure Laterality Date   ABDOMINAL HYSTERECTOMY  2009   BREAST RECONSTRUCTION Bilateral 2010   DILATATION AND CURETTAGE/HYSTEROSCOPY WITH MINERVA     FUNCTIONAL ENDOSCOPIC SINUS SURGERY  12/24/2022   Septoplasty Submucosal Resectioning of Inferior Turbinates   LEG  SURGERY     MASTECTOMY Bilateral 2009   REPLACEMENT TOTAL KNEE Bilateral    SALPINGOOPHORECTOMY Bilateral     Family History  Problem Relation Age of Onset   Colon cancer Mother 65   Multiple myeloma Mother 10   Prostate cancer Father 38   Breast cancer Sister 15   Cancer Brother 60       oral   Breast cancer Brother 69   Breast cancer Paternal Grandmother 65   Skin cancer Maternal Aunt        70s   Colon cancer Maternal Uncle 76   Stomach cancer Maternal Uncle 53   Breast cancer Paternal Aunt 12   Melanoma Cousin 17       maternal    Social History:  reports that she has never smoked. She has never used smokeless tobacco. She reports that she does not currently use alcohol. She reports that she does not use drugs.The patient is alone today. She is married and lives at home with her spouse. She has 2 biological children and has adopted two of her grandchildren. She works, and has never been exposed to chemicals or other toxic agents.  Allergies:  Allergies  Allergen Reactions   Sulfamethoxazole-Trimethoprim Hives    Facial swelling with hives  Other Reaction(s): Rash on face    Current Medications: Current Outpatient Medications  Medication Sig Dispense Refill   aspirin EC 81 MG tablet Take 81 mg by mouth daily. Swallow whole.     Cyanocobalamin  (VITAMIN B12 PO) Take 1 tablet by mouth daily. Strength unknown     dicyclomine (BENTYL) 20 MG tablet Take 20 mg by mouth as directed.     DULoxetine (CYMBALTA) 60 MG capsule Take 60 mg by mouth daily.     HYDROcodone-acetaminophen  (NORCO/VICODIN) 5-325 MG tablet Take 1 tablet by mouth daily as needed for moderate pain or severe pain.     levothyroxine  (SYNTHROID ) 88 MCG tablet Take 1 tablet (88 mcg total) by mouth daily before breakfast. 90 tablet 0   omeprazole (PRILOSEC) 40 MG capsule Take 40 mg by mouth daily.     pregabalin (LYRICA) 100 MG capsule Take 100 mg by mouth daily.     rosuvastatin (CRESTOR) 40 MG tablet Take 40  mg by mouth daily.     Semaglutide, 1 MG/DOSE, (OZEMPIC, 1 MG/DOSE,) 4 MG/3ML SOPN Inject 1 mg into the skin once a week.     tiZANidine (ZANAFLEX) 4 MG tablet Take 4 mg by mouth daily.     VITAMIN D PO Take 1 tablet by mouth daily. Strength unknown     No current facility-administered medications for this visit.    REVIEW OF SYSTEMS:  Review of Systems  Constitutional: Negative.  Negative for appetite change, chills, diaphoresis, fatigue, fever and unexpected weight change.  HENT:  Negative.  Negative for hearing loss, lump/mass, mouth sores, nosebleeds, sore throat, tinnitus, trouble swallowing and voice change.        Swollen, itchy right neck node  Eyes: Negative.  Negative for eye problems and icterus.  Respiratory: Negative.  Negative for chest tightness, cough, hemoptysis, shortness of breath and wheezing.   Cardiovascular: Negative.  Negative for chest pain, leg swelling and palpitations.  Gastrointestinal:  Negative for abdominal distention, abdominal pain, blood in stool, constipation, diarrhea, nausea, rectal pain and vomiting.  Endocrine: Negative.   Genitourinary: Negative.  Negative for bladder incontinence, difficulty urinating, dyspareunia, dysuria, frequency, hematuria, menstrual problem, nocturia, pelvic pain, vaginal bleeding and vaginal discharge.   Musculoskeletal:  Positive for arthralgias (knee), back pain (lower) and myalgias. Negative for flank pain, gait problem, neck pain and neck stiffness.  Skin:  Positive for rash (of right upper thigh, resolved). Negative for itching and wound.  Neurological: Negative.  Negative for dizziness, extremity weakness, gait problem, headaches, light-headedness, numbness, seizures and speech difficulty.  Hematological: Negative.  Negative for adenopathy. Does not bruise/bleed easily.  Psychiatric/Behavioral: Negative.  Negative for confusion, decreased concentration, depression, sleep disturbance and suicidal ideas. The patient is not  nervous/anxious.     VITALS:  Blood pressure 139/80, pulse 77, temperature 98.1 F (36.7 C), temperature source Oral, resp. rate 18, height 5' 7 (1.702 m), weight 241 lb 1.6 oz (109.4 kg), SpO2 100%.  Wt Readings from Last 3 Encounters:  06/21/24 241 lb 1.6 oz (109.4 kg)  06/16/24 244 lb (110.7 kg)  02/04/24 240 lb (108.9 kg)    Body mass index is 37.76 kg/m.  Performance status (ECOG): 1 - Symptomatic but completely ambulatory  PHYSICAL EXAM:  Physical Exam Vitals and nursing note reviewed. Exam conducted with a chaperone present.  Constitutional:      General: She is not in acute distress.    Appearance: Normal appearance. She is normal weight. She is not ill-appearing, toxic-appearing or diaphoretic.  HENT:     Head: Normocephalic and atraumatic.     Right Ear: Tympanic membrane, ear canal and external ear normal. There is no impacted cerumen.     Left Ear: Tympanic membrane, ear canal and external ear normal. There is no impacted cerumen.     Nose: Nose normal. No congestion or rhinorrhea.     Mouth/Throat:     Mouth: Mucous membranes are moist.     Pharynx: Oropharynx is clear. No oropharyngeal exudate or posterior oropharyngeal erythema.  Eyes:     General: No scleral icterus.       Right eye: No discharge.        Left eye: No discharge.     Extraocular Movements: Extraocular movements intact.     Conjunctiva/sclera: Conjunctivae normal.     Pupils: Pupils are equal, round, and reactive to light.  Neck:     Vascular: No carotid bruit.  Cardiovascular:     Rate and Rhythm: Normal rate and regular rhythm.     Pulses: Normal pulses.     Heart sounds: Normal heart sounds. No murmur heard.    No friction rub. No gallop.  Pulmonary:     Effort: Pulmonary effort is normal. No respiratory distress.     Breath sounds: No stridor. No wheezing, rhonchi or rales.  Chest:     Chest wall: No tenderness.     Comments: Bilateral reconstructions are negative.  Abdominal:  General: Bowel sounds are normal. There is no distension.     Palpations: Abdomen is soft. There is no hepatomegaly, splenomegaly or mass.     Tenderness: There is no abdominal tenderness. There is no right CVA tenderness, left CVA tenderness, guarding or rebound.     Hernia: No hernia is present.  Musculoskeletal:        General: No swelling, tenderness, deformity or signs of injury. Normal range of motion.     Cervical back: Normal range of motion and neck supple. No rigidity or tenderness.     Right lower leg: No edema.     Left lower leg: No edema.  Lymphadenopathy:     Cervical: No cervical adenopathy.  Skin:    General: Skin is warm and dry.     Coloration: Skin is not jaundiced or pale.     Findings: No bruising, erythema, lesion or rash.  Neurological:     General: No focal deficit present.     Mental Status: She is alert and oriented to person, place, and time. Mental status is at baseline.     Cranial Nerves: No cranial nerve deficit.     Sensory: No sensory deficit.     Motor: No weakness.     Coordination: Coordination normal.     Gait: Gait normal.     Deep Tendon Reflexes: Reflexes normal.  Psychiatric:        Mood and Affect: Mood normal.        Behavior: Behavior normal.        Thought Content: Thought content normal.        Judgment: Judgment normal.    LABS:      Latest Ref Rng & Units 06/21/2024    3:17 PM 12/10/2023    4:04 PM 06/28/2023    9:50 AM  CBC  WBC 4.0 - 10.5 K/uL 7.0  7.9  5.7   Hemoglobin 12.0 - 15.0 g/dL 86.2  85.8  86.6   Hematocrit 36.0 - 46.0 % 42.6  42.2  41.7   Platelets 150 - 400 K/uL 262  279  265.0       Latest Ref Rng & Units 06/21/2024    3:17 PM 12/10/2023    4:04 PM 06/28/2023    9:50 AM  CMP  Glucose 70 - 99 mg/dL 859  81  75   BUN 8 - 23 mg/dL 18  17  13    Creatinine 0.44 - 1.00 mg/dL 9.10  9.15  9.19   Sodium 135 - 145 mmol/L 139  142  138   Potassium 3.5 - 5.1 mmol/L 3.8  3.5  3.8   Chloride 98 - 111 mmol/L 104  104  103    CO2 22 - 32 mmol/L 24  25  29    Calcium 8.9 - 10.3 mg/dL 8.9  8.9  9.0   Total Protein 6.5 - 8.1 g/dL 6.6  6.8  6.3   Total Bilirubin 0.0 - 1.2 mg/dL 1.0  1.1  1.2   Alkaline Phos 38 - 126 U/L 88  99  74   AST 15 - 41 U/L 23  20  13    ALT 0 - 44 U/L 10  11  11     Lab Results  Component Value Date   TSH 0.31 (L) 12/17/2023   Lab Results  Component Value Date   CEA1 2.7 12/05/2021   /  CEA  Date Value Ref Range Status  12/05/2021 2.7 0.0 - 4.7 ng/mL Final  Comment:    (NOTE)                             Nonsmokers          <3.9                             Smokers             <5.6 [employer] Electrochemiluminescence Immunoassay (ECLIA) Values obtained with different assay methods or kits cannot be used interchangeably.  Results cannot be interpreted as absolute evidence of the presence or absence of malignant disease. Performed At: [facility] [address] [place], [place] [id number] Jennette Shorter MD Ey:[id number]    No results found for: PSA1 No results found for: CAN199 Lab Results  Component Value Date   CAN125 5.1 12/05/2021    No results found for: TOTALPROTELP, ALBUMINELP, A1GS, A2GS, BETS, BETA2SER, GAMS, MSPIKE, SPEI No results found for: TIBC, FERRITIN, IRONPCTSAT No results found for: LDH  STUDIES:        I,Jasmine M Lassiter,acting as a scribe for [name], MD.,have documented all relevant documentation on the behalf of [name], MD,as directed by  [name], MD while in the presence of [name], MD.

## 2024-06-21 ENCOUNTER — Other Ambulatory Visit: Payer: Self-pay | Admitting: Oncology

## 2024-06-21 ENCOUNTER — Telehealth: Payer: Self-pay | Admitting: Oncology

## 2024-06-21 ENCOUNTER — Inpatient Hospital Stay (HOSPITAL_BASED_OUTPATIENT_CLINIC_OR_DEPARTMENT_OTHER): Admitting: Oncology

## 2024-06-21 ENCOUNTER — Inpatient Hospital Stay: Attending: Oncology

## 2024-06-21 ENCOUNTER — Encounter: Payer: Self-pay | Admitting: Oncology

## 2024-06-21 VITALS — BP 139/80 | HR 77 | Temp 98.1°F | Resp 18 | Ht 67.0 in | Wt 241.1 lb

## 2024-06-21 DIAGNOSIS — Z170 Estrogen receptor positive status [ER+]: Secondary | ICD-10-CM

## 2024-06-21 DIAGNOSIS — Z1502 Genetic susceptibility to malignant neoplasm of ovary: Secondary | ICD-10-CM

## 2024-06-21 DIAGNOSIS — M47816 Spondylosis without myelopathy or radiculopathy, lumbar region: Secondary | ICD-10-CM | POA: Diagnosis not present

## 2024-06-21 DIAGNOSIS — Z1509 Genetic susceptibility to other malignant neoplasm: Secondary | ICD-10-CM | POA: Diagnosis not present

## 2024-06-21 DIAGNOSIS — Z1501 Genetic susceptibility to malignant neoplasm of breast: Secondary | ICD-10-CM | POA: Diagnosis not present

## 2024-06-21 DIAGNOSIS — Z803 Family history of malignant neoplasm of breast: Secondary | ICD-10-CM | POA: Insufficient documentation

## 2024-06-21 DIAGNOSIS — C50011 Malignant neoplasm of nipple and areola, right female breast: Secondary | ICD-10-CM

## 2024-06-21 DIAGNOSIS — Z8673 Personal history of transient ischemic attack (TIA), and cerebral infarction without residual deficits: Secondary | ICD-10-CM | POA: Insufficient documentation

## 2024-06-21 DIAGNOSIS — Z853 Personal history of malignant neoplasm of breast: Secondary | ICD-10-CM | POA: Insufficient documentation

## 2024-06-21 DIAGNOSIS — C773 Secondary and unspecified malignant neoplasm of axilla and upper limb lymph nodes: Secondary | ICD-10-CM

## 2024-06-21 DIAGNOSIS — Z9013 Acquired absence of bilateral breasts and nipples: Secondary | ICD-10-CM | POA: Insufficient documentation

## 2024-06-21 DIAGNOSIS — M8580 Other specified disorders of bone density and structure, unspecified site: Secondary | ICD-10-CM | POA: Insufficient documentation

## 2024-06-21 DIAGNOSIS — Z807 Family history of other malignant neoplasms of lymphoid, hematopoietic and related tissues: Secondary | ICD-10-CM | POA: Insufficient documentation

## 2024-06-21 DIAGNOSIS — C50912 Malignant neoplasm of unspecified site of left female breast: Secondary | ICD-10-CM | POA: Diagnosis not present

## 2024-06-21 DIAGNOSIS — C50012 Malignant neoplasm of nipple and areola, left female breast: Secondary | ICD-10-CM

## 2024-06-21 DIAGNOSIS — Z800 Family history of malignant neoplasm of digestive organs: Secondary | ICD-10-CM | POA: Diagnosis not present

## 2024-06-21 DIAGNOSIS — R590 Localized enlarged lymph nodes: Secondary | ICD-10-CM

## 2024-06-21 DIAGNOSIS — Z8042 Family history of malignant neoplasm of prostate: Secondary | ICD-10-CM | POA: Insufficient documentation

## 2024-06-21 DIAGNOSIS — M797 Fibromyalgia: Secondary | ICD-10-CM | POA: Diagnosis not present

## 2024-06-21 LAB — CMP (CANCER CENTER ONLY)
ALT: 10 U/L (ref 0–44)
AST: 23 U/L (ref 15–41)
Albumin: 4.3 g/dL (ref 3.5–5.0)
Alkaline Phosphatase: 88 U/L (ref 38–126)
Anion gap: 12 (ref 5–15)
BUN: 18 mg/dL (ref 8–23)
CO2: 24 mmol/L (ref 22–32)
Calcium: 8.9 mg/dL (ref 8.9–10.3)
Chloride: 104 mmol/L (ref 98–111)
Creatinine: 0.89 mg/dL (ref 0.44–1.00)
GFR, Estimated: >60 mL/min (ref >60)
Glucose, Bld: 140 mg/dL — ABNORMAL HIGH (ref 70–99)
Potassium: 3.8 mmol/L (ref 3.5–5.1)
Sodium: 139 mmol/L (ref 135–145)
Total Bilirubin: 1 mg/dL (ref 0.0–1.2)
Total Protein: 6.6 g/dL (ref 6.5–8.1)

## 2024-06-21 LAB — CBC WITH DIFFERENTIAL (CANCER CENTER ONLY)
Abs Immature Granulocytes: 0.02 K/uL (ref 0.00–0.07)
Basophils Absolute: 0.1 K/uL (ref 0.0–0.1)
Basophils Relative: 1 %
Eosinophils Absolute: 0.5 K/uL (ref 0.0–0.5)
Eosinophils Relative: 8 %
HCT: 42.6 % (ref 36.0–46.0)
Hemoglobin: 13.7 g/dL (ref 12.0–15.0)
Immature Granulocytes: 0 %
Lymphocytes Relative: 30 %
Lymphs Abs: 2.1 K/uL (ref 0.7–4.0)
MCH: 27.6 pg (ref 26.0–34.0)
MCHC: 32.2 g/dL (ref 30.0–36.0)
MCV: 85.7 fL (ref 80.0–100.0)
Monocytes Absolute: 0.4 K/uL (ref 0.1–1.0)
Monocytes Relative: 6 %
Neutro Abs: 3.8 K/uL (ref 1.7–7.7)
Neutrophils Relative %: 55 %
Platelet Count: 262 K/uL (ref 150–400)
RBC: 4.97 MIL/uL (ref 3.87–5.11)
RDW: 14.8 % (ref 11.5–15.5)
WBC Count: 7 K/uL (ref 4.0–10.5)
nRBC: 0 % (ref 0.0–0.2)

## 2024-06-21 NOTE — Telephone Encounter (Signed)
 Patient has been scheduled for follow-up visit per 06/21/24 LOS.  Pt noted appt details on personal planner/calendar.

## 2024-06-26 ENCOUNTER — Ambulatory Visit (HOSPITAL_BASED_OUTPATIENT_CLINIC_OR_DEPARTMENT_OTHER)
Admission: RE | Admit: 2024-06-26 | Discharge: 2024-06-26 | Disposition: A | Discharge status: Home / Self Care | Source: Ambulatory Visit | Attending: Oncology | Admitting: Oncology

## 2024-06-26 DIAGNOSIS — R590 Localized enlarged lymph nodes: Secondary | ICD-10-CM | POA: Diagnosis not present

## 2024-06-27 ENCOUNTER — Other Ambulatory Visit: Payer: Self-pay

## 2024-06-27 ENCOUNTER — Telehealth: Payer: Self-pay

## 2024-06-27 DIAGNOSIS — R9389 Abnormal findings on diagnostic imaging of other specified body structures: Secondary | ICD-10-CM

## 2024-06-27 DIAGNOSIS — Z800 Family history of malignant neoplasm of digestive organs: Secondary | ICD-10-CM

## 2024-06-27 DIAGNOSIS — R109 Unspecified abdominal pain: Secondary | ICD-10-CM

## 2024-06-27 NOTE — Telephone Encounter (Signed)
 Pt was notified of the MRI ordered and scheduled I on 07/07/2023 at 8:00 AM at [facility]. Pt to arrive at 7:45 AM. Nothing to eat or drink 4 hours prior.  Pt stated that she might have to change that appointment due to a conflict in her schedule.  Pt was given the number to radiology [phone] Pt verbalized understanding with all questions answered.

## 2024-06-27 NOTE — Telephone Encounter (Signed)
 Pt was notified of the MRI ordered and scheduled I on 07/07/2023 at 8:00 AM at [facility]. Pt to arrive at 7:45 AM. Nothing to eat or drink 4 hours prior.  Pt stated that she might have to change that appointment due to a conflict in her schedule.  Pt was given the number to radiology [phone] Pt verbalized understanding with all questions answered.

## 2024-06-27 NOTE — Telephone Encounter (Signed)
 Patient is returning a call back to the [place]. Patient is requesting that Elspeth returns her call. Please advise.

## 2024-06-27 NOTE — Telephone Encounter (Signed)
-----   Message from Nurse Elspeth RAMAN sent at 06/28/2023  8:30 AM EDT ----- Personal reminder sent 06/28/2023 Rpt MRI in 1 year

## 2024-06-27 NOTE — Telephone Encounter (Signed)
 Pt was made aware of reminder that was received in Epic.  Pt was in agrrement for the repeat MRI  Pt was ordered and scheduled for the MRI on 07/07/2023 at 8:00 AM at [facility]. Pt to arrive at 7:45 AM. Nothing to eat or drink 4 hours prior.  Left message for pt to call back

## 2024-06-30 ENCOUNTER — Telehealth: Payer: Self-pay

## 2024-06-30 NOTE — Telephone Encounter (Signed)
 Pt called to get results from ultrasound on 06/26/2024.

## 2024-07-04 ENCOUNTER — Other Ambulatory Visit: Payer: Self-pay | Admitting: Oncology

## 2024-07-04 DIAGNOSIS — R590 Localized enlarged lymph nodes: Secondary | ICD-10-CM

## 2024-07-04 HISTORY — DX: Localized enlarged lymph nodes: R59.0

## 2024-07-06 ENCOUNTER — Ambulatory Visit (HOSPITAL_COMMUNITY)
Admission: RE | Admit: 2024-07-06 | Discharge: 2024-07-06 | Disposition: A | Discharge status: Home / Self Care | Source: Ambulatory Visit | Attending: Gastroenterology | Admitting: Gastroenterology

## 2024-07-06 ENCOUNTER — Ambulatory Visit: Payer: Self-pay | Admitting: Gastroenterology

## 2024-07-06 ENCOUNTER — Other Ambulatory Visit: Payer: Self-pay | Admitting: Gastroenterology

## 2024-07-06 DIAGNOSIS — L819 Disorder of pigmentation, unspecified: Secondary | ICD-10-CM | POA: Diagnosis not present

## 2024-07-06 DIAGNOSIS — Z800 Family history of malignant neoplasm of digestive organs: Secondary | ICD-10-CM | POA: Diagnosis not present

## 2024-07-06 DIAGNOSIS — D485 Neoplasm of uncertain behavior of skin: Secondary | ICD-10-CM | POA: Diagnosis not present

## 2024-07-06 DIAGNOSIS — C259 Malignant neoplasm of pancreas, unspecified: Secondary | ICD-10-CM | POA: Diagnosis not present

## 2024-07-06 DIAGNOSIS — B079 Viral wart, unspecified: Secondary | ICD-10-CM | POA: Diagnosis not present

## 2024-07-06 DIAGNOSIS — R109 Unspecified abdominal pain: Secondary | ICD-10-CM | POA: Insufficient documentation

## 2024-07-06 DIAGNOSIS — D692 Other nonthrombocytopenic purpura: Secondary | ICD-10-CM | POA: Diagnosis not present

## 2024-07-06 DIAGNOSIS — D2239 Melanocytic nevi of other parts of face: Secondary | ICD-10-CM | POA: Diagnosis not present

## 2024-07-06 DIAGNOSIS — L814 Other melanin hyperpigmentation: Secondary | ICD-10-CM | POA: Diagnosis not present

## 2024-07-06 DIAGNOSIS — R9389 Abnormal findings on diagnostic imaging of other specified body structures: Secondary | ICD-10-CM | POA: Insufficient documentation

## 2024-07-06 DIAGNOSIS — L821 Other seborrheic keratosis: Secondary | ICD-10-CM | POA: Diagnosis not present

## 2024-07-06 MED ORDER — GADOBUTROL 1 MMOL/ML IV SOLN
10.0000 mL | Freq: Once | INTRAVENOUS | Status: AC | PRN
  Administered 2024-07-06: 10 mL via INTRAVENOUS

## 2024-07-13 ENCOUNTER — Ambulatory Visit (HOSPITAL_COMMUNITY)
Admission: RE | Admit: 2024-07-13 | Discharge: 2024-07-13 | Disposition: A | Discharge status: Home / Self Care | Source: Ambulatory Visit | Attending: Oncology | Admitting: Oncology

## 2024-07-13 DIAGNOSIS — R590 Localized enlarged lymph nodes: Secondary | ICD-10-CM | POA: Diagnosis not present

## 2024-07-13 MED ORDER — IOHEXOL 300 MG/ML  SOLN
75.0000 mL | Freq: Once | INTRAMUSCULAR | Status: AC | PRN
  Administered 2024-07-13: 75 mL via INTRAVENOUS

## 2024-07-18 ENCOUNTER — Other Ambulatory Visit: Payer: Self-pay

## 2024-07-18 DIAGNOSIS — C50912 Malignant neoplasm of unspecified site of left female breast: Secondary | ICD-10-CM

## 2024-07-18 DIAGNOSIS — R590 Localized enlarged lymph nodes: Secondary | ICD-10-CM

## 2024-07-19 ENCOUNTER — Other Ambulatory Visit: Payer: Self-pay | Admitting: Hematology and Oncology

## 2024-07-19 ENCOUNTER — Encounter (INDEPENDENT_AMBULATORY_CARE_PROVIDER_SITE_OTHER): Payer: Self-pay

## 2024-07-19 DIAGNOSIS — R590 Localized enlarged lymph nodes: Secondary | ICD-10-CM

## 2024-07-19 NOTE — Progress Notes (Signed)
 [facility] [place]  Reason for Message:  Patient with suspected new cancer, ultrasound guided biopsy needed and requesting review and advisement by your provider team.    Patient Name:  Laura Hill DOB:  1961-11-16  Brief HPI:  History of bilateral stage IIA breast cancers, (T2 N0 M0) on the right and (T1 N1 M0) on the left, diagnosed in 2009. 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 She received dose dense AC followed by weekly Taxol as well as endocrine therapy for a total of 10 years. She has undergone bilateral mastectomies with reconstruction. She remains without evidence of  recurrence.   BRCA2 mutation. Therefore, she has undergone bilateral mastectomies as well as hysterectomy and bilateral salpingo oophorectomy.   History of colon cancer and pancreatic cancer in the family. She did have a colonoscopy and EGD in 2023 and the colon polyp of the transverse colon was hyperplastic. Dr. Charlanne has recommended repeat in 3-5 years. He also recommends periodic abdominal scans to monitor the pancreas since her brother died of pancreatic cancer last year.    4. History of prior stroke. CT of her brain revealed a previous stroke and she was placed on Aspirin 81mg  daily.    5. Right neck node which was swollen and itchy. I really don't palpate anything today but we will check this with an ultrasound of the soft tissue neck.    Plan: She informed me that a couple weeks ago the right lymph node in her neck became swollen and itchy, this resolved on its on until about a week ago, when it happened again. She also notes developing a itchy bumpy rash on her right upper thigh for 3-4 days that has since resolved. I will schedule an ultrasound of the soft tissue neck and call her with those results She has a WBC of 7.0, hemoglobin of 13.7, and platelet count of 262,000. Her CMP is completely normal. Her last colonoscopy  was done in 2023 and she was recommended a follow-up in 5 years. Her PCP does routine labs every 6 months. I will see her back in 6 months with CBC and CMP. She understands and agrees with this plan of care. I have answered her questions and she knows to call with any concerns.   Ultrasound:IMPRESSION: 1. Enlarged right cervical level II lymph nodes, measuring up to 1.9 x 0.8 cm. Mildly prominent, although not pathologically enlarged left-sided lymph nodes measuring up to 1.2 x 0.6 cm. Findings are nonspecific and may be reactive, although lymphoproliferative disorder or metastatic disease is not excluded. Consider tissue sampling or PET-CT.  Request:  ultrasound guided biopsy  Please advise if you would like an official referral sent after patient review, and any comments we need to add to the referral to ensure it is expedited.  We appreciate your review and collaboration with this patient.  [name], NP

## 2024-07-20 ENCOUNTER — Other Ambulatory Visit (HOSPITAL_BASED_OUTPATIENT_CLINIC_OR_DEPARTMENT_OTHER): Admitting: Radiology

## 2024-07-20 DIAGNOSIS — Z853 Personal history of malignant neoplasm of breast: Secondary | ICD-10-CM | POA: Diagnosis not present

## 2024-07-20 DIAGNOSIS — Z87898 Personal history of other specified conditions: Secondary | ICD-10-CM | POA: Diagnosis not present

## 2024-07-20 DIAGNOSIS — I889 Nonspecific lymphadenitis, unspecified: Secondary | ICD-10-CM | POA: Diagnosis not present

## 2024-07-21 ENCOUNTER — Encounter (INDEPENDENT_AMBULATORY_CARE_PROVIDER_SITE_OTHER): Payer: Self-pay | Admitting: Otolaryngology

## 2024-07-21 ENCOUNTER — Ambulatory Visit (INDEPENDENT_AMBULATORY_CARE_PROVIDER_SITE_OTHER): Admitting: Otolaryngology

## 2024-07-21 VITALS — BP 130/79 | HR 90

## 2024-07-21 DIAGNOSIS — J329 Chronic sinusitis, unspecified: Secondary | ICD-10-CM

## 2024-07-21 DIAGNOSIS — R590 Localized enlarged lymph nodes: Secondary | ICD-10-CM

## 2024-07-21 DIAGNOSIS — Z853 Personal history of malignant neoplasm of breast: Secondary | ICD-10-CM

## 2024-07-21 DIAGNOSIS — J0100 Acute maxillary sinusitis, unspecified: Secondary | ICD-10-CM

## 2024-07-21 DIAGNOSIS — R0982 Postnasal drip: Secondary | ICD-10-CM

## 2024-07-21 MED ORDER — AMOXICILLIN-POT CLAVULANATE 875-125 MG PO TABS
1.0000 | ORAL_TABLET | Freq: Two times a day (BID) | ORAL | 0 refills | Status: DC
Start: 2024-07-21 — End: 2024-08-09

## 2024-07-21 MED ORDER — METHYLPREDNISOLONE 4 MG PO TBPK: ORAL_TABLET | ORAL | 1 refills | Status: AC

## 2024-07-21 NOTE — Progress Notes (Signed)
 ENT CONSULT:  Reason for Consult: cervical lymphadenopathy   HPI: Discussed the use of AI scribe software for clinical note transcription with the patient, who gave verbal consent to proceed.  History of Present Illness Laura Hill is a 63 year old female with a history of breast cancer who presents with concerns about swollen lymph nodes.  She noticed a swollen lymph node on the right side of her neck that was itchy and fluctuated in size, initially swelling up and then reducing before swelling again about a week later. A CT scan showed the largest lymph node measuring 1.9 cm. No dental issues or cold symptoms were present at the time of the lymph node swelling, although she did have a cold a couple of weeks after the swelling occurred. An ultrasound indicated indistinct margins of the lymph nodes, and normal architecture was not observed (borders were not well-defined).  She experiences trouble swallowing, which has been a persistent issue. During endoscopies performed alongside colonoscopies, her esophagus sometimes needs to be stretched.  She has a history of sinus infections and underwent sinus surgery about a year and a half ago due to impacted sinuses. She uses a neti pot for sinus drainage. She is allergic to sulfa medications.  She has a significant family history of cancer and denies any history of smoking.   Records Reviewed:  Oncology note 06/21/24 Assessment: History of bilateral stage IIA breast cancers, (T2 N0 M0) on the right and (T1 N1 M0) on the left, diagnosed in 2009. 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 She received dose dense AC followed by weekly Taxol as well as endocrine therapy for a total of 10 years. She has undergone bilateral mastectomies with reconstruction. She remains without evidence of  recurrence.   BRCA2 mutation. Therefore, she has undergone bilateral mastectomies as well as hysterectomy and bilateral salpingo  oophorectomy.   History of colon cancer and pancreatic cancer in the family. She did have a colonoscopy and EGD in 2023 and the colon polyp of the transverse colon was hyperplastic. Dr. Charlanne has recommended repeat in 3-5 years. He also recommends periodic abdominal scans to monitor the pancreas since her brother died of pancreatic cancer last year.    4. History of prior stroke. CT of her brain revealed a previous stroke and she was placed on Aspirin 81mg  daily.    5. Right neck node which was swollen and itchy. I really don't palpate anything today but we will check this with an ultrasound of the soft tissue neck.       Past Medical History:  Diagnosis Date   B12 deficiency    Breast cancer (HCC) 2009   Double Masectomy and lymph node removal bilaterally   Chronic pain disorder    DDD (degenerative disc disease), cervical    Fibromyalgia    GERD (gastroesophageal reflux disease)    Hashimoto's disease    Hypercholesteremia    Hyperlipidemia    Irritable bowel syndrome (IBS)    Mass of soft tissue of chest 09/03/2022   Migraine, cervicogenic    Osteoporosis    Thyroid  disease    Vitamin D deficiency     Past Surgical History:  Procedure Laterality Date   ABDOMINAL HYSTERECTOMY  2009   BREAST RECONSTRUCTION Bilateral 2010   DILATATION AND CURETTAGE/HYSTEROSCOPY WITH MINERVA     FUNCTIONAL ENDOSCOPIC SINUS SURGERY  12/24/2022   Septoplasty Submucosal Resectioning of Inferior Turbinates   LEG SURGERY     MASTECTOMY Bilateral  2009   REPLACEMENT TOTAL KNEE Bilateral    SALPINGOOPHORECTOMY Bilateral     Family History  Problem Relation Age of Onset   Colon cancer Mother 14   Multiple myeloma Mother 32   Prostate cancer Father 74   Breast cancer Sister 81   Cancer Brother 65       oral   Breast cancer Brother 61   Breast cancer Paternal Grandmother 87   Skin cancer Maternal Aunt        70s   Colon cancer Maternal Uncle 76   Stomach cancer Maternal Uncle 65   Breast  cancer Paternal Aunt 57   Melanoma Cousin 23       maternal    Social History:  reports that she has never smoked. She has never used smokeless tobacco. She reports that she does not currently use alcohol. She reports that she does not use drugs.  Allergies:  Allergies  Allergen Reactions   Sulfamethoxazole-Trimethoprim Hives    Facial swelling with hives  Other Reaction(s): Rash on face    Medications: I have reviewed the patient's current medications.  The PMH, PSH, Medications, Allergies, and SH were reviewed and updated.  ROS: Constitutional: Negative for fever, weight loss and weight gain. Cardiovascular: Negative for chest pain and dyspnea on exertion. Respiratory: Is not experiencing shortness of breath at rest. Gastrointestinal: Negative for nausea and vomiting. Neurological: Negative for headaches. Psychiatric: The patient is not nervous/anxious  Blood pressure 130/79, pulse 90, SpO2 95%. There is no height or weight on file to calculate BMI.  PHYSICAL EXAM:  Exam: General: Well-developed, well-nourished Respiratory Respiratory effort: Equal inspiration and expiration without stridor Cardiovascular Peripheral Vascular: Warm extremities with equal color/perfusion Eyes: No nystagmus with equal extraocular motion bilaterally Neuro/Psych/Balance: Patient oriented to person, place, and time; Appropriate mood and affect; Gait is intact with no imbalance; Cranial nerves I-XII are intact Head and Face Inspection: Normocephalic and atraumatic without mass or lesion Palpation: Facial skeleton intact without bony stepoffs Salivary Glands: No mass or tenderness Facial Strength: Facial motility symmetric and full bilaterally ENT Pinna: External ear intact and fully developed External canal: Canal is patent with intact skin Tympanic Membrane: Clear and mobile External Nose: No scar or anatomic deformity Internal Nose: Septum is relatively straight. No polyp, minimal mucus  and purulence posterior nasal passage/at the choana. Mucosal edema and erythema present.  Bilateral inferior turbinate hypertrophy.  Lips, Teeth, and gums: Mucosa and teeth intact and viable TMJ: No pain to palpation with full mobility Oral cavity/oropharynx: No erythema or exudate, no lesions present Nasopharynx: No mass or lesion with intact mucosa Hypopharynx: Intact mucosa without pooling of secretions Larynx Glottic: Full true vocal cord mobility without lesion or mass Supraglottic: Normal appearing epiglottis and AE folds Interarytenoid Space: Moderate pachydermia&edema Subglottic Space: Patent without lesion or edema Neck Neck and Trachea: Midline trachea without mass or lesion Thyroid : No mass or nodularity Lymphatics: No lymphadenopathy  Procedure: Preoperative diagnosis: cervical lymphadenopathy  Postoperative diagnosis:   Same  Procedure: Flexible fiberoptic laryngoscopy  Surgeon: Elena Larry, MD  Anesthesia: Topical lidocaine  and Afrin Complications: None Condition is stable throughout exam  Indications and consent:  The patient presents to the clinic with above symptoms. Indirect laryngoscopy view was incomplete. Thus it was recommended that they undergo a flexible fiberoptic laryngoscopy. All of the risks, benefits, and potential complications were reviewed with the patient preoperatively and verbal informed consent was obtained.  Procedure: The patient was seated upright in the clinic. Topical lidocaine  and Afrin  were applied to the nasal cavity. After adequate anesthesia had occurred, I then proceeded to pass the flexible telescope into the nasal cavity. The nasal cavity was patent without rhinorrhea or polyp. The nasopharynx was also patent without mass or lesion. The base of tongue was visualized and was normal. There were no signs of pooling of secretions in the piriform sinuses. The true vocal folds were mobile bilaterally. There were no signs of glottic or  supraglottic mucosal lesion or mass. There was moderate interarytenoid pachydermia and post cricoid edema. The telescope was then slowly withdrawn and the patient tolerated the procedure throughout.   Studies Reviewed: CT neck 07/13/24 FINDINGS: CT NECK   Limited Intracranial and Visualized Orbits: Negative.   Mastoids and Visualized Paranasal Sinuses: Clear.   Pharynx and Larynx: Normal. No mass or edema.   Salivary Glands: No inflammation, mass, or calculus.   Thyroid : Normal.   Lymph Nodes: Enlarged right cervical level II lymph nodes, measuring up to 1.9 x 0.8 cm (series 3, image 25). Mildly prominent, although not pathologically enlarged left-sided lymph nodes measuring up to 1.2 x 0.6 cm (series 3, image 31).   Vascular: Negative.   Musculoskeletal: No acute osseous findings.   Other: None.   CT CHEST   Cardiovascular: No significant vascular findings. Normal heart size. No pericardial effusion.   Mediastinum/Nodes: No enlarged mediastinal, hilar, or axillary lymph nodes. Thyroid  gland, trachea, and esophagus demonstrate no significant findings.   Lungs/Pleura: Lungs are clear. No pleural effusion or pneumothorax.   Upper Abdomen: No acute abnormality.  Hepatic steatosis.   Musculoskeletal: Status post bilateral mastectomy. No acute osseous findings.   IMPRESSION: 1. Enlarged right cervical level II lymph nodes, measuring up to 1.9 x 0.8 cm. Mildly prominent, although not pathologically enlarged left-sided lymph nodes measuring up to 1.2 x 0.6 cm. Findings are nonspecific and may be reactive, although lymphoproliferative disorder or metastatic disease is not excluded. Consider tissue sampling or PET-CT. 2. No evidence of lymphadenopathy or metastatic disease in the chest. 3. Status post bilateral mastectomy. 4. Hepatic steatosis.  U/S neck 06/26/24  Assessment/Plan: Encounter Diagnoses  Name Primary?   Lymphadenopathy of right cervical region Yes    Subacute maxillary sinusitis    History of breast cancer     Assessment and Plan Assessment & Plan Enlarged right cervical lymph node, pending biopsy Enlarged right cervical lymph node with history of breast cancer, fluctuating size. CT showed R level II 1.9 cm node, ultrasound showed indistinct margins. Nodes not pathologically enlarged by CT criteria. Biopsy needed for evaluation. - Proceed with ordered biopsy of the lymph node. - If biopsy normal, consider repeat imaging in a few months. - If nodes grow or persist, consider excisional biopsy.  Chronic sinus drainage/sinusitis noted on exam Chronic sinus drainage post-sinus surgery, managed with neti pot. Recent cold reported, drainage noted during passage of the scope today - Prescribe Augmentin  and steroids for sinus drainage.      Thank you for allowing me to participate in the care of this patient. Please do not hesitate to contact me with any questions or concerns.   Elena Larry, MD Otolaryngology [facility] Phone: [phone] Fax: [phone]    07/21/2024, 10:43 AM

## 2024-07-24 NOTE — Progress Notes (Unsigned)
 Laura Cornet, MD  Laura Hill PROCEDURE / BIOPSY REVIEW Date: 07/24/24  Requested Biopsy site: Right cervical lymph node Reason for request: Swollen nodes and history of breast cancer Imaging review: CT neck image 34, seq 3  Decision: Approved Imaging modality to perform: Ultrasound Schedule with: No sedation / Local anesthetic Schedule for: Any VIR  Additional comments: Right cervical nodes are small and probably only FNA.  Will need cytology.  Please contact me with questions, concerns, or if issue pertaining to this request arise.  Hill JONELLE Philip, MD [facility]       Previous Messages    ----- Message ----- From: Laura Hill Sent: 07/19/2024  11:39 AM EDT To: Laura Hill Laura; Taryn F Rigney, RT; Ir Proc* Subject: CT US  GUIDED BIOPSY                            Procedure :CT US  GUIDED BIOPSY  Reason :Right cervical lymphadenopathy identifed on CT with history of breast cancer Dx: Lymphadenopathy of right cervical region [R59.0 (ICD-10-CM)]    History :CT Chest W Contrast,CT Soft Tissue Neck W Contrast ,MR 3D Recon At [facility] ABDOMEN MRCP W WO CONTAST,US  Soft Tissue Head/NeckUS THYROID   Provider: Harl Eleanor LABOR, NP  Provider contact ; [phone]

## 2024-07-28 DIAGNOSIS — Z1339 Encounter for screening examination for other mental health and behavioral disorders: Secondary | ICD-10-CM | POA: Diagnosis not present

## 2024-07-28 DIAGNOSIS — Z23 Encounter for immunization: Secondary | ICD-10-CM | POA: Diagnosis not present

## 2024-07-28 DIAGNOSIS — E038 Other specified hypothyroidism: Secondary | ICD-10-CM | POA: Diagnosis not present

## 2024-07-28 DIAGNOSIS — Z1331 Encounter for screening for depression: Secondary | ICD-10-CM | POA: Diagnosis not present

## 2024-07-28 DIAGNOSIS — E063 Autoimmune thyroiditis: Secondary | ICD-10-CM | POA: Diagnosis not present

## 2024-07-28 DIAGNOSIS — C50919 Malignant neoplasm of unspecified site of unspecified female breast: Secondary | ICD-10-CM | POA: Diagnosis not present

## 2024-07-28 DIAGNOSIS — R7303 Prediabetes: Secondary | ICD-10-CM | POA: Diagnosis not present

## 2024-07-28 DIAGNOSIS — Z6837 Body mass index (BMI) 37.0-37.9, adult: Secondary | ICD-10-CM | POA: Diagnosis not present

## 2024-07-28 LAB — LAB REPORT - SCANNED: A1c: 6.5

## 2024-08-08 DIAGNOSIS — E7800 Pure hypercholesterolemia, unspecified: Secondary | ICD-10-CM | POA: Insufficient documentation

## 2024-08-08 DIAGNOSIS — E785 Hyperlipidemia, unspecified: Secondary | ICD-10-CM | POA: Insufficient documentation

## 2024-08-08 DIAGNOSIS — M810 Age-related osteoporosis without current pathological fracture: Secondary | ICD-10-CM | POA: Insufficient documentation

## 2024-08-08 DIAGNOSIS — E538 Deficiency of other specified B group vitamins: Secondary | ICD-10-CM | POA: Insufficient documentation

## 2024-08-08 DIAGNOSIS — M5030 Other cervical disc degeneration, unspecified cervical region: Secondary | ICD-10-CM | POA: Insufficient documentation

## 2024-08-08 DIAGNOSIS — E079 Disorder of thyroid, unspecified: Secondary | ICD-10-CM | POA: Insufficient documentation

## 2024-08-08 DIAGNOSIS — E559 Vitamin D deficiency, unspecified: Secondary | ICD-10-CM | POA: Insufficient documentation

## 2024-08-08 DIAGNOSIS — G894 Chronic pain syndrome: Secondary | ICD-10-CM | POA: Insufficient documentation

## 2024-08-08 NOTE — Progress Notes (Unsigned)
 Cardiology Office Note:  .   Date:  08/09/2024  ID:  Laura Hill, DOB [birth year], MRN [id number] PCP: Clemmie Nest, MD  [facility] Providers Cardiologist:  Lamar Fitch, MD Cardiology APP:  Carlin Nest BROCKS, NP    History of Present Illness: .   Laura Hill is a 63 y.o. female with a past medical history of migraines, OSA on CPAP, GERD, IBS, hypothyroidism, history of breast cancer, fibromyalgia, stroke, dyslipidemia.  07/01/2023 carotid duplex no evidence of stenosis 07/01/2023 echo EF 60-65%, mild MR 06/30/2023 monitor average HR of 83 bpm, 1 run of NSVT, 7 episodes of SVT  She established care with Dr. Fitch on 06/07/2023 for evaluation of syncope at the request of her PCP.  Episode of syncope at occurred as she woke up in the middle of the night to take her dog outside, felt dizzy, and then passed out.  She had obtained a CT of her head by her PCP which showed an old infarct.  Carotid ultrasound was arranged and completed on 07/01/2023 this revealed no stenosis bilaterally.  Echocardiogram was arranged and completed on 07/01/2023 this revealed an EF of 60 to 65%, mild MR.  A monitor was arranged which revealed an average heart rate 83 bpm, predominant rhythm was sinus, 1 run of VT lasting 7 beats, 7 episodes of SVT.  Most recently evaluated by myself on 08/10/2023, no further episodes of dizziness, advised to increase water intake and continue with walking for exercise, advised to follow up in 1 year.   She presents today for follow up. She has been doing well since she was last evaluated in our office, no complaints from a cardiac perspective, no recurrent episodes of overt dizziness or syncope or near syncope.  She does mention she was recently on a cruise and had some pedal edema however this is resolved.  Follows closely with her PCP and sees them about every 6 months.  Reviewed lab work from their office it appears her cholesterol is well-controlled. She denies chest  pain, palpitations, dyspnea, pnd, orthopnea, n, v, dizziness, syncope, edema, weight gain, or early satiety.   ROS: Review of Systems  All other systems reviewed and are negative.    Studies Reviewed: SABRA   EKG Interpretation Date/Time:  Wednesday August 09 2024 10:55:59 EDT Ventricular Rate:  81 PR Interval:  150 QRS Duration:  84 QT Interval:  388 QTC Calculation: 450 R Axis:   50  Text Interpretation: Normal sinus rhythm Normal ECG When compared with ECG of 07-Jun-2023 08:43, No significant change was found Confirmed by Carlin Nest [phone]) on 08/09/2024 10:58:13 AM    Cardiac Studies & Procedures   ______________________________________________________________________________________________     ECHOCARDIOGRAM  ECHOCARDIOGRAM COMPLETE 07/01/2023  Narrative ECHOCARDIOGRAM REPORT    Patient Name:   Laura Hill Date of Exam: 07/01/2023 Medical Rec #:  [id number]      Height:       67.0 in Accession #:    [id number]     Weight:       219.0 lb Date of Birth:  [birth year]      BSA:          2.102 m Patient Age:    61 years       BP:           106/60 mmHg Patient Gender: F              HR:           71  bpm. Exam Location:  [place]  Procedure: 2D Echo, Cardiac Doppler, Color Doppler and Strain Analysis  Indications:    Syncope and collapse [M44 (ICD-10-CM)]  History:        Patient has no prior history of Echocardiogram examinations. Bilateral breast cancer; Risk Factors:Dyslipidemia.  Sonographer:    Lynwood Silvas RDCS Referring Phys: [phone] LAMAR PARAS KRASOWSKI  IMPRESSIONS   1. Left ventricular ejection fraction, by estimation, is 60 to 65%. The left ventricle has normal function. The left ventricle has no regional wall motion abnormalities. Left ventricular diastolic parameters were normal. The average left ventricular global longitudinal strain is -17.7 %. The global longitudinal strain is normal. 2. Right ventricular systolic function is normal. The right  ventricular size is normal. There is normal pulmonary artery systolic pressure. 3. The mitral valve is normal in structure. Mild mitral valve regurgitation. No evidence of mitral stenosis. 4. The aortic valve is normal in structure. Aortic valve regurgitation is not visualized. No aortic stenosis is present. 5. The inferior vena cava is normal in size with greater than 50% respiratory variability, suggesting right atrial pressure of 3 mmHg.  FINDINGS Left Ventricle: Left ventricular ejection fraction, by estimation, is 60 to 65%. The left ventricle has normal function. The left ventricle has no regional wall motion abnormalities. The average left ventricular global longitudinal strain is -17.7 %. The global longitudinal strain is normal. The left ventricular internal cavity size was normal in size. There is no left ventricular hypertrophy. Left ventricular diastolic parameters were normal.  Right Ventricle: The right ventricular size is normal. No increase in right ventricular wall thickness. Right ventricular systolic function is normal. There is normal pulmonary artery systolic pressure. The tricuspid regurgitant velocity is 1.97 m/s, and with an assumed right atrial pressure of 3 mmHg, the estimated right ventricular systolic pressure is 18.5 mmHg.  Left Atrium: Left atrial size was normal in size.  Right Atrium: Right atrial size was normal in size.  Pericardium: There is no evidence of pericardial effusion.  Mitral Valve: The mitral valve is normal in structure. Mild mitral valve regurgitation. No evidence of mitral valve stenosis.  Tricuspid Valve: The tricuspid valve is normal in structure. Tricuspid valve regurgitation is mild . No evidence of tricuspid stenosis.  Aortic Valve: The aortic valve is normal in structure. Aortic valve regurgitation is not visualized. No aortic stenosis is present.  Pulmonic Valve: The pulmonic valve was normal in structure. Pulmonic valve regurgitation is  not visualized. No evidence of pulmonic stenosis.  Aorta: The aortic root is normal in size and structure.  Venous: The inferior vena cava is normal in size with greater than 50% respiratory variability, suggesting right atrial pressure of 3 mmHg.  IAS/Shunts: No atrial level shunt detected by color flow Doppler.   LEFT VENTRICLE PLAX 2D LVIDd:         4.30 cm   Diastology LVIDs:         2.70 cm   LV e' medial:    9.57 cm/s LV PW:         1.10 cm   LV E/e' medial:  6.8 LV IVS:        0.90 cm   LV e' lateral:   9.46 cm/s LVOT diam:     2.00 cm   LV E/e' lateral: 6.9 LV SV:         56 LV SV Index:   27        2D Longitudinal Strain LVOT Area:  3.14 cm  2D Strain GLS Avg:     -17.7 %   RIGHT VENTRICLE             IVC RV Basal diam:  3.00 cm     IVC diam: 1.50 cm RV S prime:     11.70 cm/s TAPSE (M-mode): 2.3 cm  LEFT ATRIUM           Index        RIGHT ATRIUM           Index LA diam:      3.50 cm 1.67 cm/m   RA Area:     13.90 cm LA Vol (A2C): 30.1 ml 14.32 ml/m  RA Volume:   34.50 ml  16.41 ml/m LA Vol (A4C): 55.1 ml 26.22 ml/m AORTIC VALVE LVOT Vmax:   81.97 cm/s LVOT Vmean:  55.167 cm/s LVOT VTI:    0.180 m  AORTA Ao Root diam: 3.20 cm Ao Asc diam:  3.20 cm Ao Desc diam: 3.10 cm  MV E velocity: 65.25 cm/s  TRICUSPID VALVE MV A velocity: 67.60 cm/s  TR Peak grad:   15.5 mmHg MV E/A ratio:  0.97        TR Vmax:        197.00 cm/s  SHUNTS Systemic VTI:  0.18 m Systemic Diam: 2.00 cm  Lamar Fitch MD Electronically signed by Lamar Fitch MD Signature Date/Time: 07/01/2023/12:08:24 PM    Final    MONITORS  LONG TERM MONITOR (3-14 DAYS) 06/28/2023  Narrative Patch Wear Time:  10 days and 13 hours (2024-07-22T09:37:22-0400 to 2024-08-01T23:25:31-0400)  Patient had a min HR of 49 bpm, max HR of 226 bpm, and avg HR of 83 bpm. Predominant underlying rhythm was Sinus Rhythm. 1 run of Ventricular Tachycardia occurred lasting 7 beats with a max rate  of 135 bpm (avg 125 bpm). 7 Supraventricular Tachycardia runs occurred, the run with the fastest interval lasting 5 beats with a max rate of 226 bpm, the longest lasting 15 beats with an avg rate of 128 bpm. Isolated SVEs were rare (<1.0%), SVE Couplets were rare (<1.0%), and SVE Triplets were rare (<1.0%). Isolated VEs were rare (<1.0%, 353), VE Couplets were rare (<1.0%, 7), and VE Triplets were rare (<1.0%, 1).  Summary and conclusions: 1 ventricular tachycardia total of 7 beats at rate of 135 bpm.  Asymptomatic. 7 episode of supraventricular tachycardia with longest episode 15 beats at rate of 128.  Asymptomatic       ______________________________________________________________________________________________      Risk Assessment/Calculations:             Physical Exam:   VS:  BP 130/70   Pulse 81   Ht 5' 7 (1.702 m)   Wt 252 lb (114.3 kg)   SpO2 94%   BMI 39.47 kg/m    Wt Readings from Last 3 Encounters:  08/09/24 252 lb (114.3 kg)  06/21/24 241 lb 1.6 oz (109.4 kg)  06/16/24 244 lb (110.7 kg)    GEN: Well nourished, well developed in no acute distress NECK: No JVD; No carotid bruits CARDIAC: RRR, 1/6 systolic murmur, rubs, gallops RESPIRATORY:  Clear to auscultation without rales, wheezing or rhonchi  ABDOMEN: Soft, non-tender, non-distended EXTREMITIES:  No edema; No deformity   ASSESSMENT AND PLAN: .   Syncope and collapse- one episode as outlined above in the HPI, no further recurrence.   Echo was unrevealing, carotid artery ultrasound was unrevealing.  She did wear a monitor that revealed 1 episode of  NSVT and several episodes of SVT, she is otherwise unbothered by palpitations.   Palpitations/NSVT/SVT-quiescent, she was very bothered by palpitations earlier in her life however does not notice them now.  History of stroke-this was noted on MRI that was arranged by her PCP, evidence of old infarct.  She was started on aspirin by her PCP and her Crestor was  increased.  Dyslipidemia-most recent LDL was very well-controlled at 51, continue Crestor 40 mg daily, currently monitored by her PCP.       Dispo: Follow up in 2 years.   Signed, Delon JAYSON Hoover, NP

## 2024-08-09 ENCOUNTER — Encounter: Payer: Self-pay | Admitting: Cardiology

## 2024-08-09 ENCOUNTER — Ambulatory Visit: Attending: Cardiology | Admitting: Cardiology

## 2024-08-09 VITALS — BP 130/70 | HR 81 | Ht 67.0 in | Wt 252.0 lb

## 2024-08-09 DIAGNOSIS — I4729 Other ventricular tachycardia: Secondary | ICD-10-CM | POA: Diagnosis not present

## 2024-08-09 DIAGNOSIS — I6930 Unspecified sequelae of cerebral infarction: Secondary | ICD-10-CM | POA: Diagnosis not present

## 2024-08-09 DIAGNOSIS — R002 Palpitations: Secondary | ICD-10-CM | POA: Diagnosis not present

## 2024-08-09 DIAGNOSIS — E782 Mixed hyperlipidemia: Secondary | ICD-10-CM

## 2024-08-09 DIAGNOSIS — R55 Syncope and collapse: Secondary | ICD-10-CM | POA: Diagnosis not present

## 2024-08-09 NOTE — Patient Instructions (Signed)
 Medication Instructions:  Your physician recommends that you continue on your current medications as directed. Please refer to the Current Medication list given to you today.  *If you need a refill on your cardiac medications before your next appointment, please call your pharmacy*  Lab Work: NONE If you have labs (blood work) drawn today and your tests are completely normal, you will receive your results only by: MyChart Message (if you have MyChart) OR A paper copy in the mail If you have any lab test that is abnormal or we need to change your treatment, we will call you to review the results.  Testing/Procedures: NONE  Follow-Up: At [facility], you and your health needs are our priority.  As part of our continuing mission to provide you with exceptional heart care, our providers are all part of one team.  This team includes your primary Cardiologist (physician) and Advanced Practice Providers or APPs (Physician Assistants and Nurse Practitioners) who all work together to provide you with the care you need, when you need it.  Your next appointment:   2 year(s)  Provider:   Lamar Fitch, MD    We recommend signing up for the patient portal called MyChart.  Sign up information is provided on this After Visit Summary.  MyChart is used to connect with patients for Virtual Visits (Telemedicine).  Patients are able to view lab/test results, encounter notes, upcoming appointments, etc.  Non-urgent messages can be sent to your provider as well.   To learn more about what you can do with MyChart, go to [email or web address].   Other Instructions

## 2024-08-10 ENCOUNTER — Ambulatory Visit (HOSPITAL_COMMUNITY)
Admission: RE | Admit: 2024-08-10 | Discharge: 2024-08-10 | Disposition: A | Discharge status: Home / Self Care | Source: Ambulatory Visit | Attending: Hematology and Oncology | Admitting: Hematology and Oncology

## 2024-08-10 DIAGNOSIS — R591 Generalized enlarged lymph nodes: Secondary | ICD-10-CM | POA: Diagnosis not present

## 2024-08-10 DIAGNOSIS — Z853 Personal history of malignant neoplasm of breast: Secondary | ICD-10-CM | POA: Insufficient documentation

## 2024-08-10 DIAGNOSIS — R590 Localized enlarged lymph nodes: Secondary | ICD-10-CM | POA: Diagnosis not present

## 2024-08-10 MED ORDER — LIDOCAINE HCL (PF) 1 % IJ SOLN
10.0000 mL | Freq: Once | INTRAMUSCULAR | Status: AC
  Administered 2024-08-10: 10 mL

## 2024-08-10 NOTE — Procedures (Signed)
 Vascular and Interventional Radiology Procedure Note  Patient: Laura Hill DOB: Aug 05, 1961 Medical Record Number: [id number] Note Date/Time: 08/10/24 1:21 PM   Performing Physician: Thom Hall, MD Assistant(s): None  Diagnosis: R cervical LN. Hx breast CA   Procedure: RIGHT CERVICAL LYMPH NODE FINE NEEDLE ASPIRATION  Anesthesia: Local Anesthetic Complications: None Estimated Blood Loss: Minimal Specimens: Sent for Cytology  Findings:  Successful Ultrasound-guided biopsy of a R cervical LN. A total of 3 samples were obtained. Hemostasis of the tract was achieved using Manual Pressure.  Plan: Bed rest for 0 hours.  See detailed procedure note with images in PACS. The patient tolerated the procedure well without incident or complication and was returned to Recovery in stable condition.    Thom Hall, MD Vascular and Interventional Radiology Specialists [facility] Radiology   Pager. [phone] Clinic. [phone]

## 2024-08-11 LAB — CYTOLOGY - NON PAP

## 2024-08-14 ENCOUNTER — Ambulatory Visit (INDEPENDENT_AMBULATORY_CARE_PROVIDER_SITE_OTHER): Payer: Self-pay

## 2024-08-14 NOTE — Telephone Encounter (Signed)
 Let patient know her results. Patient understood.

## 2024-08-21 ENCOUNTER — Telehealth: Payer: Self-pay

## 2024-08-21 NOTE — Telephone Encounter (Signed)
 Laura Hill asking Dr Cornelius if they should pursue the PET scan that she had suggested? She states the biopsy had no cancer cells in that one spot. However, since the lymph node has swollen up again and is itchy.. thinks it maybe a good idea to do the PET. Please advise.

## 2024-08-22 ENCOUNTER — Other Ambulatory Visit: Payer: Self-pay | Admitting: Oncology

## 2024-08-22 DIAGNOSIS — Z1589 Genetic susceptibility to other disease: Secondary | ICD-10-CM

## 2024-08-22 DIAGNOSIS — C50011 Malignant neoplasm of nipple and areola, right female breast: Secondary | ICD-10-CM

## 2024-08-22 DIAGNOSIS — R590 Localized enlarged lymph nodes: Secondary | ICD-10-CM

## 2024-08-22 DIAGNOSIS — C50912 Malignant neoplasm of unspecified site of left female breast: Secondary | ICD-10-CM

## 2024-08-22 NOTE — Telephone Encounter (Signed)
 Contacted pt to schedule an appt. Unable to reach via phone, voicemail was left.

## 2024-08-24 ENCOUNTER — Encounter (HOSPITAL_BASED_OUTPATIENT_CLINIC_OR_DEPARTMENT_OTHER): Payer: Self-pay | Admitting: Radiology

## 2024-08-25 ENCOUNTER — Telehealth: Payer: Self-pay | Admitting: Gastroenterology

## 2024-08-25 NOTE — Telephone Encounter (Signed)
 Inbound call from patient stating she would like to speak to nurse in regards to a growth she found on her anus. Patient states its been there for over a month and hasn't gone away.  Requesting a call back  Please advise  Thank you

## 2024-08-25 NOTE — Telephone Encounter (Signed)
 Spoke with patient & she has a small (maybe 1/2 inch) growth on her anus that's been there for about 2 months. Tender when sitting for long periods & spongy to touch. She has a PET scan scheduled for Monday d/t some reactive lymphnodes, and is concerned this may be r/t. Requesting to see a provider sooner than November which she is currently scheduled for. Tentatively will place her for a 7 day hold 10/22 at 11:00 am with Dr. Charlanne. She's aware Elspeth, RN will contact her 10/15 to book and confirm the appointment.

## 2024-08-28 ENCOUNTER — Other Ambulatory Visit: Payer: Self-pay

## 2024-08-28 ENCOUNTER — Inpatient Hospital Stay: Attending: Oncology

## 2024-08-28 ENCOUNTER — Ambulatory Visit (INDEPENDENT_AMBULATORY_CARE_PROVIDER_SITE_OTHER)
Admission: RE | Admit: 2024-08-28 | Discharge: 2024-08-28 | Disposition: A | Discharge status: Home / Self Care | Source: Ambulatory Visit | Attending: Oncology | Admitting: Oncology

## 2024-08-28 DIAGNOSIS — M545 Low back pain, unspecified: Secondary | ICD-10-CM | POA: Diagnosis not present

## 2024-08-28 DIAGNOSIS — R599 Enlarged lymph nodes, unspecified: Secondary | ICD-10-CM | POA: Diagnosis not present

## 2024-08-28 DIAGNOSIS — Z79899 Other long term (current) drug therapy: Secondary | ICD-10-CM | POA: Insufficient documentation

## 2024-08-28 DIAGNOSIS — Z1589 Genetic susceptibility to other disease: Secondary | ICD-10-CM

## 2024-08-28 DIAGNOSIS — Z15068 Genetic susceptibility to other malignant neoplasm of digestive system: Secondary | ICD-10-CM

## 2024-08-28 DIAGNOSIS — Z860100 Personal history of colon polyps, unspecified: Secondary | ICD-10-CM | POA: Diagnosis not present

## 2024-08-28 DIAGNOSIS — Z8673 Personal history of transient ischemic attack (TIA), and cerebral infarction without residual deficits: Secondary | ICD-10-CM | POA: Diagnosis not present

## 2024-08-28 DIAGNOSIS — M797 Fibromyalgia: Secondary | ICD-10-CM | POA: Insufficient documentation

## 2024-08-28 DIAGNOSIS — Z9013 Acquired absence of bilateral breasts and nipples: Secondary | ICD-10-CM | POA: Diagnosis not present

## 2024-08-28 DIAGNOSIS — Z1501 Genetic susceptibility to malignant neoplasm of breast: Secondary | ICD-10-CM

## 2024-08-28 DIAGNOSIS — E063 Autoimmune thyroiditis: Secondary | ICD-10-CM | POA: Diagnosis not present

## 2024-08-28 DIAGNOSIS — Z79890 Hormone replacement therapy (postmenopausal): Secondary | ICD-10-CM | POA: Diagnosis not present

## 2024-08-28 DIAGNOSIS — Z1502 Genetic susceptibility to malignant neoplasm of ovary: Secondary | ICD-10-CM | POA: Diagnosis not present

## 2024-08-28 DIAGNOSIS — R590 Localized enlarged lymph nodes: Secondary | ICD-10-CM

## 2024-08-28 DIAGNOSIS — Z7982 Long term (current) use of aspirin: Secondary | ICD-10-CM | POA: Diagnosis not present

## 2024-08-28 DIAGNOSIS — Z1505 Genetic susceptibility to malignant neoplasm of fallopian tube(s): Secondary | ICD-10-CM

## 2024-08-28 DIAGNOSIS — Z1507 Genetic susceptibility to malignant neoplasm of urinary tract: Secondary | ICD-10-CM | POA: Diagnosis not present

## 2024-08-28 DIAGNOSIS — C50011 Malignant neoplasm of nipple and areola, right female breast: Secondary | ICD-10-CM

## 2024-08-28 DIAGNOSIS — Z800 Family history of malignant neoplasm of digestive organs: Secondary | ICD-10-CM | POA: Diagnosis not present

## 2024-08-28 DIAGNOSIS — Z96653 Presence of artificial knee joint, bilateral: Secondary | ICD-10-CM | POA: Diagnosis not present

## 2024-08-28 DIAGNOSIS — Z853 Personal history of malignant neoplasm of breast: Secondary | ICD-10-CM | POA: Diagnosis not present

## 2024-08-28 DIAGNOSIS — Z808 Family history of malignant neoplasm of other organs or systems: Secondary | ICD-10-CM | POA: Diagnosis not present

## 2024-08-28 DIAGNOSIS — I2510 Atherosclerotic heart disease of native coronary artery without angina pectoris: Secondary | ICD-10-CM | POA: Diagnosis not present

## 2024-08-28 DIAGNOSIS — Z1509 Genetic susceptibility to other malignant neoplasm: Secondary | ICD-10-CM | POA: Diagnosis not present

## 2024-08-28 DIAGNOSIS — Z170 Estrogen receptor positive status [ER+]: Secondary | ICD-10-CM | POA: Diagnosis not present

## 2024-08-28 DIAGNOSIS — M8580 Other specified disorders of bone density and structure, unspecified site: Secondary | ICD-10-CM | POA: Diagnosis not present

## 2024-08-28 DIAGNOSIS — Z807 Family history of other malignant neoplasms of lymphoid, hematopoietic and related tissues: Secondary | ICD-10-CM | POA: Diagnosis not present

## 2024-08-28 DIAGNOSIS — C50912 Malignant neoplasm of unspecified site of left female breast: Secondary | ICD-10-CM

## 2024-08-28 LAB — CBC WITH DIFFERENTIAL (CANCER CENTER ONLY)
Abs Immature Granulocytes: 0.01 K/uL (ref 0.00–0.07)
Basophils Absolute: 0.1 K/uL (ref 0.0–0.1)
Basophils Relative: 1 %
Eosinophils Absolute: 0.4 K/uL (ref 0.0–0.5)
Eosinophils Relative: 7 %
HCT: 42 % (ref 36.0–46.0)
Hemoglobin: 13.7 g/dL (ref 12.0–15.0)
Immature Granulocytes: 0 %
Lymphocytes Relative: 27 %
Lymphs Abs: 1.6 K/uL (ref 0.7–4.0)
MCH: 27.8 pg (ref 26.0–34.0)
MCHC: 32.6 g/dL (ref 30.0–36.0)
MCV: 85.2 fL (ref 80.0–100.0)
Monocytes Absolute: 0.3 K/uL (ref 0.1–1.0)
Monocytes Relative: 6 %
Neutro Abs: 3.5 K/uL (ref 1.7–7.7)
Neutrophils Relative %: 59 %
Platelet Count: 234 K/uL (ref 150–400)
RBC: 4.93 MIL/uL (ref 3.87–5.11)
RDW: 13.8 % (ref 11.5–15.5)
WBC Count: 5.9 K/uL (ref 4.0–10.5)
nRBC: 0 % (ref 0.0–0.2)

## 2024-08-28 LAB — CMP (CANCER CENTER ONLY)
ALT: 20 U/L (ref 0–44)
AST: 29 U/L (ref 15–41)
Albumin: 4.3 g/dL (ref 3.5–5.0)
Alkaline Phosphatase: 79 U/L (ref 38–126)
Anion gap: 11 (ref 5–15)
BUN: 16 mg/dL (ref 8–23)
CO2: 25 mmol/L (ref 22–32)
Calcium: 9.3 mg/dL (ref 8.9–10.3)
Chloride: 104 mmol/L (ref 98–111)
Creatinine: 0.85 mg/dL (ref 0.44–1.00)
GFR, Estimated: >60 mL/min (ref >60)
Glucose, Bld: 96 mg/dL (ref 70–99)
Potassium: 3.9 mmol/L (ref 3.5–5.1)
Sodium: 140 mmol/L (ref 135–145)
Total Bilirubin: 2.1 mg/dL — ABNORMAL HIGH (ref 0.0–1.2)
Total Protein: 6.7 g/dL (ref 6.5–8.1)

## 2024-08-28 LAB — SEDIMENTATION RATE: Sed Rate: 5 mm/h (ref 0–22)

## 2024-08-28 LAB — LACTATE DEHYDROGENASE: LDH: 167 U/L (ref 98–192)

## 2024-08-28 MED ORDER — FLUDEOXYGLUCOSE F - 18 (FDG) INJECTION
16.6000 | Freq: Once | INTRAVENOUS | Status: AC | PRN
  Administered 2024-08-28: 13.66 via INTRAVENOUS

## 2024-08-29 ENCOUNTER — Other Ambulatory Visit: Payer: Self-pay

## 2024-08-29 ENCOUNTER — Inpatient Hospital Stay (HOSPITAL_BASED_OUTPATIENT_CLINIC_OR_DEPARTMENT_OTHER): Admitting: Hematology and Oncology

## 2024-08-29 VITALS — BP 110/51 | HR 82 | Temp 98.8°F | Resp 14 | Ht 67.0 in | Wt 242.0 lb

## 2024-08-29 DIAGNOSIS — Z1501 Genetic susceptibility to malignant neoplasm of breast: Secondary | ICD-10-CM | POA: Diagnosis not present

## 2024-08-29 DIAGNOSIS — R590 Localized enlarged lymph nodes: Secondary | ICD-10-CM

## 2024-08-29 DIAGNOSIS — Z9013 Acquired absence of bilateral breasts and nipples: Secondary | ICD-10-CM | POA: Diagnosis not present

## 2024-08-29 DIAGNOSIS — Z1502 Genetic susceptibility to malignant neoplasm of ovary: Secondary | ICD-10-CM | POA: Diagnosis not present

## 2024-08-29 DIAGNOSIS — Z8673 Personal history of transient ischemic attack (TIA), and cerebral infarction without residual deficits: Secondary | ICD-10-CM | POA: Diagnosis not present

## 2024-08-29 DIAGNOSIS — Z853 Personal history of malignant neoplasm of breast: Secondary | ICD-10-CM | POA: Diagnosis not present

## 2024-08-29 DIAGNOSIS — C50912 Malignant neoplasm of unspecified site of left female breast: Secondary | ICD-10-CM

## 2024-08-29 DIAGNOSIS — Z1507 Genetic susceptibility to malignant neoplasm of urinary tract: Secondary | ICD-10-CM

## 2024-08-29 DIAGNOSIS — Z860100 Personal history of colon polyps, unspecified: Secondary | ICD-10-CM | POA: Diagnosis not present

## 2024-08-29 DIAGNOSIS — Z7982 Long term (current) use of aspirin: Secondary | ICD-10-CM | POA: Diagnosis not present

## 2024-08-29 DIAGNOSIS — M8580 Other specified disorders of bone density and structure, unspecified site: Secondary | ICD-10-CM | POA: Diagnosis not present

## 2024-08-29 LAB — BETA 2 MICROGLOBULIN, SERUM: Beta-2 Microglobulin: 2 mg/L (ref 0.6–2.4)

## 2024-08-29 NOTE — Progress Notes (Signed)
 [facility]  [address] [place],  [place]  [address] [phone]  Clinic Day: 06/21/24  Referring physician: Clemmie Nest, MD  ASSESSMENT & PLAN:  Assessment: History of bilateral stage IIA breast cancers, (T2 N0 M0) on the right and (T1 N1 M0) on the left, diagnosed in 2009. 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 She received dose dense AC followed by weekly Taxol as well as endocrine therapy for a total of 10 years. She has undergone bilateral mastectomies with reconstruction. She remains without evidence of  recurrence.  BRCA2 mutation. Therefore, she has undergone bilateral mastectomies as well as hysterectomy and bilateral salpingo oophorectomy.  History of colon cancer and pancreatic cancer in the family. She did have a colonoscopy and EGD in 2023 and the colon polyp of the transverse colon was hyperplastic. Dr. Charlanne has recommended repeat in 3-5 years. He also recommends periodic abdominal scans to monitor the pancreas since her brother died of pancreatic cancer last year.   4. History of prior stroke. CT of her brain revealed a previous stroke and she was placed on Aspirin 81mg  daily.   5. Right neck node which was swollen and itchy. I really don't palpate anything today but we will check this with an ultrasound of the soft tissue neck. PET imaging was also obtained which revealed 1. Low level hypermetabolism associated with the right sided level II cervical lymph nodes identified on previous neck CT. 2. Tiny focus of mucosal hypermetabolism in the posterior nasopharynx. Correlation with direct visualization recommended. 3. No evidence for hypermetabolic metastatic disease in the chest, abdomen or pelvis.   Plan: We reviewed her PET imaging with PET results as above. We discussed that in light of her history, a biopsy would be recommended. She is agreeable to this.  We will send for biopsy and have her return once pathology is  obtained.   I provided 20 minutes of face-to-face time during this this encounter and > 50% was spent counseling as documented under my assessment and plan.   Laura DELENA Bach, NP [facility] OF [facility] [address] [place] [address] Dept: [phone] Dept Fax: [phone]   Orders Placed This Encounter  Procedures   US  Guided Needle Placement    Standing Status:   Future    Expiration Date:   08/29/2025    Reason for Exam (SYMPTOM  OR DIAGNOSIS REQUIRED):   PET positive right sided level II cervical lymph node    Preferred imaging location?:   [facility]   CHIEF COMPLAINT:  CC: History of bilateral stage IIA breast cancers; BRCA2 positive  Current Treatment:  Surveillance  HISTORY OF PRESENT ILLNESS:  Laura Hill is a 63 y.o. female who presents as a transfer of care for the continued evaluation and management due to her history of bilateral stage IIA breast cancers, (T2 N0 M0) on the right and (T1 N1 M0) on the left. 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 This was originally diagnosed in 2009. She did receive dose dense AC followed by 12 weeks of weekly Taxol. Due to her BRCA2 positivity, she underwent bilateral mastectomies as well as hysterectomy and bilateral salpingo oophorectomy. She also received endocrine therapy with tamoxifen for 3 years followed by 7 years of letrozole. She did receive Boneva for osteopenia while on letrozole, and most recent bone density from May 2022 was  normal. She did undergo bilateral breast reconstructions in 2010.  Lab evaluation from December 2022 revealed a normal CBC and CMP was unremarkable. 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 There is a tiny hypodense lesion of the left lobe of the liver, which is too small to accurately characterize, but statistically likely to reflect cyst. I reviewed the images with the patient and have difficulty even seeing this lesion. MRI pelvis from December 23rd revealed sigmoid diverticulosis, without evidence of active diverticulitis. No definite evidence of colovaginal fistula. She lost her brother to pancreatic cancer in 2024. I scheduled a CT scan of abdomen and pelvis in January 2024. This revealed pancreatic atrophy but no evidence of malignancy.   PAST MEDICAL HISTORY  I have reviewed her chart and materials related to her cancer extensively and collaborated history with the patient. Summary of oncologic history is as follows: Oncology History  Bilateral breast cancer (HCC)  11/22/2007 Initial Diagnosis   Breast cancer in female [facility])   07/25/2008 Cancer Staging   Staging form: Breast, AJCC 6th Edition - Clinical stage from 07/25/2008: Stage IIA (T2, N0, M0) - Signed by Laura Wanda DEL, MD on 12/15/2021 Staged by: Staging assigned at another facility Diagnostic confirmation: Positive histology Specimen type: Excision Laterality: Right Stage prefix: Initial diagnosis   Breast cancer 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 lymph node, left (HCC)  11/22/2007 Initial Diagnosis   Breast cancer 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 lymph node, left (HCC)   07/25/2008 Cancer Staging   Staging form: Breast, AJCC 6th Edition - Clinical stage from 07/25/2008: Stage IIA (T1, N1, M0) - Signed by Laura Wanda DEL, MD on 12/15/2021 Staged by: Staging assigned at another facility Diagnostic confirmation: Positive histology Specimen type: Excision Laterality: Left Total positive nodes: 2 Stage prefix: Initial diagnosis Staging comments: Adjuvant DD AC followed by Taxol, tamoxifen x 3 years, Letrozole x 7 yrs   See summary above for Oncology history   INTERVAL HISTORY:  Laura Hill is here today for  a follow up for her history of bilateral stage IIA breast cancers; BRCA2 positive. She also has a history of esophageal stricture that occasionally requires dilatation. She does have Hashimoto's disease and fibromyalgia and is on observation only. Patient states that she feels well but complains of knee and lower back pain. She informed me that a couple weeks ago the right lymph node in her neck became swollen and itchy, this resolved on its on until about a week ago, when it happened again. She also notes developing a itchy bumpy rash on her right upper thigh for 3-4 days that has since resolved. I will schedule an ultrasound of the soft tissue neck and call her with those results She has a WBC of 7.0, hemoglobin of 13.7, and platelet count of 262,000. Her CMP is completely normal. Her last colonoscopy was done in 2023 and was recommended a follow-up in 5 years.Her PCP does routine labs every 6 months. I will see her back in 6 months with CBC and CMP. She denies fever, chills, night sweats, or other signs of infection. She denies cardiac, respiratory or gastrointestinal issues. She  denies pain. Her appetite is poor and Her weight has increased 6 pounds over last 6 months.  HISTORY:   Past Medical History:  Diagnosis Date   B12 deficiency    Bilateral  breast cancer (HCC) 11/22/2007   BRCA2 gene mutation positive in female 11/22/2007   Breast cancer (HCC) 2009   Double Masectomy and lymph node removal bilaterally   Breast cancer 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 lymph node, left (HCC) 11/22/2007   Cervicogenic headache 09/17/2011   Cervicogenic migraine 09/17/2011   Chronic pain disorder    DDD (degenerative disc disease), cervical    DDD (degenerative disc disease), lumbar 01/14/2009   Dyslipidemia 06/07/2023   Fibromyalgia    GERD (gastroesophageal reflux disease)    Hashimoto's disease    History of colonoscopy with polypectomy 02/11/2012   Hyperbilirubinemia 11/16/2022   Hypercholesteremia     Hyperlipidemia    Hypothyroidism 07/07/2021   Irritable bowel syndrome (IBS)    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 discovered incidentally on CT 06/07/2023   Lymphadenopathy of right cervical region 07/04/2024   Enlarged cervical nodes seen on US , up to 2.8 cm in diameter     Mass of soft tissue of chest 09/03/2022   Multinodular goiter 07/07/2021   Neuropathy 04/25/2008   Osteoarthritis 12/13/2006   Osteoporosis    Precancerous skin lesion 12/13/2018   Sleep apnea treated with continuous positive airway pressure (CPAP) 05/27/2023   Syncope and collapse 06/07/2023   Thyroid  disease    Vitamin D deficiency     Past Surgical History:  Procedure Laterality Date   ABDOMINAL HYSTERECTOMY  2009   BREAST RECONSTRUCTION Bilateral 2010   DILATATION AND CURETTAGE/HYSTEROSCOPY WITH MINERVA     FUNCTIONAL ENDOSCOPIC SINUS SURGERY  12/24/2022   Septoplasty Submucosal Resectioning of Inferior Turbinates   LEG SURGERY     MASTECTOMY Bilateral 2009   REPLACEMENT TOTAL KNEE Bilateral    SALPINGOOPHORECTOMY Bilateral     Family History  Problem Relation Age of Onset   Colon cancer Mother 5   Multiple myeloma Mother 56   Prostate cancer Father 36   Breast cancer Sister 27   Cancer Brother 90       oral   Breast cancer Brother 56   Breast cancer Paternal Grandmother 64   Skin cancer Maternal Aunt        70s   Colon cancer Maternal Uncle 76   Stomach cancer Maternal Uncle 53   Breast cancer Paternal Aunt 63   Melanoma Cousin 68       maternal    Social History:  reports that she has never smoked. She has never used smokeless tobacco. She reports that she does not currently use alcohol. She reports that she does not use drugs.The patient is alone today. She is married and lives at home with her spouse. She has 2 biological children and has adopted two of her grandchildren. She works, and has never been exposed to chemicals or other toxic  agents.  Allergies:  Allergies  Allergen Reactions   Sulfamethoxazole-Trimethoprim Hives    Facial swelling with hives  Other Reaction(s): Rash on face    Current Medications: Current Outpatient Medications  Medication Sig Dispense Refill   Cyanocobalamin  (VITAMIN B12 PO) Take 1 tablet by mouth daily. Strength unknown     dicyclomine (BENTYL) 20 MG tablet Take 20 mg by mouth as directed.     DULoxetine (CYMBALTA) 60 MG capsule Take 60 mg by mouth daily.     HYDROcodone-acetaminophen  (NORCO/VICODIN) 5-325 MG tablet Take 1 tablet by mouth daily as needed for moderate pain or severe pain.     levothyroxine  (SYNTHROID ) 88 MCG tablet Take 1 tablet (88 mcg total) by mouth daily  before breakfast. 90 tablet 0   methylPREDNISolone  (MEDROL  DOSEPAK) 4 MG TBPK tablet Take with signs of chronic sinusitis and take as directed 1 each 1   omeprazole (PRILOSEC) 40 MG capsule Take 40 mg by mouth daily.     pregabalin (LYRICA) 100 MG capsule Take 100 mg by mouth daily.     rosuvastatin (CRESTOR) 40 MG tablet Take 40 mg by mouth daily.     Semaglutide, 1 MG/DOSE, (OZEMPIC, 1 MG/DOSE,) 4 MG/3ML SOPN Inject 1 mg into the skin once a week.     tiZANidine (ZANAFLEX) 4 MG tablet Take 4 mg by mouth daily.     VITAMIN D PO Take 1 tablet by mouth daily. Strength unknown     aspirin EC 81 MG tablet Take 81 mg by mouth daily. Swallow whole.     No current facility-administered medications for this visit.    REVIEW OF SYSTEMS:  Review of Systems  Constitutional: Negative.  Negative for appetite change, chills, diaphoresis, fatigue, fever and unexpected weight change.  HENT:  Negative.  Negative for hearing loss, lump/mass, mouth sores, nosebleeds, sore throat, tinnitus, trouble swallowing and voice change.        Swollen, itchy right neck node  Eyes: Negative.  Negative for eye problems and icterus.  Respiratory: Negative.  Negative for chest tightness, cough, hemoptysis, shortness of breath and wheezing.    Cardiovascular: Negative.  Negative for chest pain, leg swelling and palpitations.  Gastrointestinal:  Negative for abdominal distention, abdominal pain, blood in stool, constipation, diarrhea, nausea, rectal pain and vomiting.  Endocrine: Negative.   Genitourinary: Negative.  Negative for bladder incontinence, difficulty urinating, dyspareunia, dysuria, frequency, hematuria, menstrual problem, nocturia, pelvic pain, vaginal bleeding and vaginal discharge.   Musculoskeletal:  Positive for arthralgias (knee), back pain (lower) and myalgias. Negative for flank pain, gait problem, neck pain and neck stiffness.  Skin:  Positive for rash (of right upper thigh, resolved). Negative for itching and wound.  Neurological: Negative.  Negative for dizziness, extremity weakness, gait problem, headaches, light-headedness, numbness, seizures and speech difficulty.  Hematological: Negative.  Negative for adenopathy. Does not bruise/bleed easily.  Psychiatric/Behavioral: Negative.  Negative for confusion, decreased concentration, depression, sleep disturbance and suicidal ideas. The patient is not nervous/anxious.     VITALS:  Blood pressure (!) 110/51, pulse 82, temperature 98.8 F (37.1 C), temperature source Oral, resp. rate 14, height 5' 7 (1.702 m), weight 242 lb (109.8 kg), SpO2 95%.  Wt Readings from Last 3 Encounters:  08/29/24 242 lb (109.8 kg)  08/09/24 252 lb (114.3 kg)  06/21/24 241 lb 1.6 oz (109.4 kg)    Body mass index is 37.9 kg/m.  Performance status (ECOG): 1 - Symptomatic but completely ambulatory  PHYSICAL EXAM:  Physical Exam Vitals and nursing note reviewed. Exam conducted with a chaperone present.  Constitutional:      General: She is not in acute distress.    Appearance: Normal appearance. She is normal weight. She is not ill-appearing, toxic-appearing or diaphoretic.  HENT:     Head: Normocephalic and atraumatic.     Right Ear: Tympanic membrane, ear canal and external ear  normal. There is no impacted cerumen.     Left Ear: Tympanic membrane, ear canal and external ear normal. There is no impacted cerumen.     Nose: Nose normal. No congestion or rhinorrhea.     Mouth/Throat:     Mouth: Mucous membranes are moist.     Pharynx: Oropharynx is clear. No oropharyngeal exudate or posterior  oropharyngeal erythema.  Eyes:     General: No scleral icterus.       Right eye: No discharge.        Left eye: No discharge.     Extraocular Movements: Extraocular movements intact.     Conjunctiva/sclera: Conjunctivae normal.     Pupils: Pupils are equal, round, and reactive to light.  Neck:     Vascular: No carotid bruit.  Cardiovascular:     Rate and Rhythm: Normal rate and regular rhythm.     Pulses: Normal pulses.     Heart sounds: Normal heart sounds. No murmur heard.    No friction rub. No gallop.  Pulmonary:     Effort: Pulmonary effort is normal. No respiratory distress.     Breath sounds: No stridor. No wheezing, rhonchi or rales.  Chest:     Chest wall: No tenderness.     Comments: Bilateral reconstructions are negative.  Abdominal:     General: Bowel sounds are normal. There is no distension.     Palpations: Abdomen is soft. There is no hepatomegaly, splenomegaly or mass.     Tenderness: There is no abdominal tenderness. There is no right CVA tenderness, left CVA tenderness, guarding or rebound.     Hernia: No hernia is present.  Musculoskeletal:        General: No swelling, tenderness, deformity or signs of injury. Normal range of motion.     Cervical back: Normal range of motion and neck supple. No rigidity or tenderness.     Right lower leg: No edema.     Left lower leg: No edema.  Lymphadenopathy:     Cervical: No cervical adenopathy.  Skin:    General: Skin is warm and dry.     Coloration: Skin is not jaundiced or pale.     Findings: No bruising, erythema, lesion or rash.  Neurological:     General: No focal deficit present.     Mental Status:  She is alert and oriented to person, place, and time. Mental status is at baseline.     Cranial Nerves: No cranial nerve deficit.     Sensory: No sensory deficit.     Motor: No weakness.     Coordination: Coordination normal.     Gait: Gait normal.     Deep Tendon Reflexes: Reflexes normal.  Psychiatric:        Mood and Affect: Mood normal.        Behavior: Behavior normal.        Thought Content: Thought content normal.        Judgment: Judgment normal.    LABS:      Latest Ref Rng & Units 08/28/2024   10:41 AM 06/21/2024    3:17 PM 12/10/2023    4:04 PM  CBC  WBC 4.0 - 10.5 K/uL 5.9  7.0  7.9   Hemoglobin 12.0 - 15.0 g/dL 86.2  86.2  85.8   Hematocrit 36.0 - 46.0 % 42.0  42.6  42.2   Platelets 150 - 400 K/uL 234  262  279       Latest Ref Rng & Units 08/28/2024   10:41 AM 06/21/2024    3:17 PM 12/10/2023    4:04 PM  CMP  Glucose 70 - 99 mg/dL 96  859  81   BUN 8 - 23 mg/dL 16  18  17    Creatinine 0.44 - 1.00 mg/dL 9.14  9.10  9.15   Sodium 135 - 145 mmol/L 140  139  142   Potassium 3.5 - 5.1 mmol/L 3.9  3.8  3.5   Chloride 98 - 111 mmol/L 104  104  104   CO2 22 - 32 mmol/L 25  24  25    Calcium 8.9 - 10.3 mg/dL 9.3  8.9  8.9   Total Protein 6.5 - 8.1 g/dL 6.7  6.6  6.8   Total Bilirubin 0.0 - 1.2 mg/dL 2.1  1.0  1.1   Alkaline Phos 38 - 126 U/L 79  88  99   AST 15 - 41 U/L 29  23  20    ALT 0 - 44 U/L 20  10  11     Lab Results  Component Value Date   TSH 0.31 (L) 12/17/2023   Lab Results  Component Value Date   CEA1 2.7 12/05/2021   /  CEA  Date Value Ref Range Status  12/05/2021 2.7 0.0 - 4.7 ng/mL Final    Comment:    (NOTE)                             Nonsmokers          <3.9                             Smokers             <5.6 [employer] Electrochemiluminescence Immunoassay (ECLIA) Values obtained with different assay methods or kits cannot be used interchangeably.  Results cannot be interpreted as absolute evidence of the presence or absence of  malignant disease. Performed At: [facility] [address] [place], [place] [id number] Jennette Shorter MD Ey:[phone]    No results found for: PSA1 No results found for: CAN199 Lab Results  Component Value Date   CAN125 5.1 12/05/2021    No results found for: TOTALPROTELP, ALBUMINELP, A1GS, A2GS, BETS, BETA2SER, GAMS, MSPIKE, SPEI No results found for: TIBC, FERRITIN, IRONPCTSAT Lab Results  Component Value Date   LDH 167 08/28/2024    STUDIES:    CLINICAL DATA:  Initial treatment strategy for cervical lymphadenopathy. Personal history of breast cancer. Family history of colon and pancreatic cancer.   EXAM: NUCLEAR MEDICINE PET SKULL BASE TO THIGH   TECHNIQUE: 13.7 mCi F-18 FDG was injected intravenously. Full-ring PET imaging was performed from the skull base to thigh after the radiotracer. CT data was obtained and used for attenuation correction and anatomic localization.   Fasting blood glucose: 99 mg/dl   COMPARISON:  Neck and chest CT 07/13/2024. Abdomen and pelvis CT 12/11/2022   FINDINGS: Mediastinal blood pool activity: SUV max 3.6   Liver activity: SUV max NA   NECK: Tiny focus of midline mucosal hypermetabolism identified in the posterior nasopharynx with SUV max = 7.6.   The right-sided level II cervical lymph nodes identified on previous neck CT show only low level hypermetabolism with SUV max = 4.7. Although lymph nodes are partially obscured by beam hardening artifact from dental fillings, dominant right-sided node is 1.6 x 0.9 cm today, similar to prior. No overt hypermetabolism in the left sided cervical lymph nodes.   Incidental CT findings: None.   CHEST: No hypermetabolic mediastinal or hilar nodes. No suspicious pulmonary nodules on the CT scan.   Incidental CT findings: Coronary artery calcification is evident.   ABDOMEN/PELVIS: No abnormal hypermetabolic activity within the liver, pancreas,  adrenal glands, or spleen. No hypermetabolic lymph nodes  in the abdomen or pelvis.   Incidental CT findings: Diverticular disease noted left colon. Small umbilical hernia contains only fat. Atherosclerotic calcification noted in the abdominal aorta.   SKELETON: No focal hypermetabolic activity to suggest skeletal metastasis. No worrisome lytic or sclerotic osseous abnormality.   Incidental CT findings: None.   IMPRESSION: 1. Low level hypermetabolism associated with the right-sided level II cervical lymph nodes identified on previous neck CT. Lymph nodes are partially obscured by beam hardening artifact from dental fillings, but dominant right-sided node is similar in size to prior. 2. Tiny focus of mucosal hypermetabolism in the posterior nasopharynx. Correlation with direct visualization recommended. 3. No evidence for hypermetabolic metastatic disease in the chest, abdomen, or pelvis. 4.  Aortic Atherosclerosis (ICD10-I70.0).     Electronically Signed   By: Camellia Candle M.D.   On: 08/29/2024 09:10

## 2024-08-30 ENCOUNTER — Encounter (HOSPITAL_COMMUNITY): Payer: Self-pay

## 2024-08-30 NOTE — Telephone Encounter (Signed)
 Left message for pt to call back

## 2024-08-30 NOTE — Progress Notes (Signed)
 [name], MD  [name] Cc: Laura Eleanor LABOR, NP PROCEDURE / BIOPSY REVIEW Date: 08/29/24  Requested Biopsy site: R neck Reason for request: HM LN Imaging review: Best seen on PET CT  Decision: Declined / Defer  Additional comments: This lesion was reviewed and successfully Bxd (see Imaging and Path report, 08/10/24).   Please contact me with questions, concerns, or if issue pertaining to this request arise.  Simmonds Hughes, MD Vascular and Interventional Radiology Specialists [facility] Radiology       Previous Messages    ----- Message ----- From: [name] Sent: 08/29/2024   3:58 PM EDT To: [name]; Ir Procedure Requests Subject: US  Guided Needle Placement                    Procedure :US  Guided Needle Placement  Reason: PET positive right sided level II cervical lymph node Dx: Lymphadenopathy of right cervical region [R59.0 (ICD-10-CM)]  History : NM PET initial skull base to thigh , US  FNA soft tissue bx . CT Chest w, CT soft tissue neck w/  Provider : Harl Eleanor LABOR, NP   Contact :  [phone]

## 2024-08-30 NOTE — Telephone Encounter (Signed)
 Reminder was received in Epic.  Pt was scheduled for 09/06/2024 at 11:00 AM to see Dr. Charlanne.  Pt made aware Pt verbalized understanding with all questions answered.

## 2024-08-30 NOTE — Telephone Encounter (Signed)
 Patient returned call, requesting a call back. Please advise.

## 2024-08-31 ENCOUNTER — Ambulatory Visit (INDEPENDENT_AMBULATORY_CARE_PROVIDER_SITE_OTHER): Admitting: Otolaryngology

## 2024-08-31 ENCOUNTER — Encounter (INDEPENDENT_AMBULATORY_CARE_PROVIDER_SITE_OTHER): Payer: Self-pay | Admitting: Otolaryngology

## 2024-08-31 VITALS — BP 136/76 | HR 76

## 2024-08-31 DIAGNOSIS — R590 Localized enlarged lymph nodes: Secondary | ICD-10-CM

## 2024-08-31 NOTE — Progress Notes (Signed)
 ENT Progress Note:   Update 08/31/2024  Discussed the use of AI scribe software for clinical note transcription with the patient, who gave verbal consent to proceed.  History of Present Illness       Records Reviewed:  Oncology note 08/29/24 5. Right neck node which was swollen and itchy. I really don't palpate anything today but we will check this with an ultrasound of the soft tissue neck. PET imaging was also obtained which revealed 1. Low level hypermetabolism associated with the right sided level II cervical lymph nodes identified on previous neck CT. 2. Tiny focus of mucosal hypermetabolism in the posterior nasopharynx. Correlation with direct visualization recommended. 3. No evidence for hypermetabolic metastatic disease in the chest, abdomen or pelvis.   Initial Evaluation  Reason for Consult: cervical lymphadenopathy   HPI: Discussed the use of AI scribe software for clinical note transcription with the patient, who gave verbal consent to proceed.  History of Present Illness Laura Hill is a 63 year old female with a history of breast cancer who presents with concerns about swollen lymph nodes.  She noticed a swollen lymph node on the right side of her neck that was itchy and fluctuated in size, initially swelling up and then reducing before swelling again about a week later. A CT scan showed the largest lymph node measuring 1.9 cm. No dental issues or cold symptoms were present at the time of the lymph node swelling, although she did have a cold a couple of weeks after the swelling occurred. An ultrasound indicated indistinct margins of the lymph nodes, and normal architecture was not observed (borders were not well-defined).  She experiences trouble swallowing, which has been a persistent issue. During endoscopies performed alongside colonoscopies, her esophagus sometimes needs to be stretched.  She has a history of sinus infections and underwent sinus surgery about a  year and a half ago due to impacted sinuses. She uses a neti pot for sinus drainage. She is allergic to sulfa medications.  She has a significant family history of cancer and denies any history of smoking.   Records Reviewed:  Oncology note 06/21/24 Assessment: History of bilateral stage IIA breast cancers, (T2 N0 M0) on the right and (T1 N1 M0) on the left, diagnosed in 2009. 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 She received dose dense AC followed by weekly Taxol as well as endocrine therapy for a total of 10 years. She has undergone bilateral mastectomies with reconstruction. She remains without evidence of  recurrence.   BRCA2 mutation. Therefore, she has undergone bilateral mastectomies as well as hysterectomy and bilateral salpingo oophorectomy.   History of colon cancer and pancreatic cancer in the family. She did have a colonoscopy and EGD in 2023 and the colon polyp of the transverse colon was hyperplastic. Dr. Charlanne has recommended repeat in 3-5 years. He also recommends periodic abdominal scans to monitor the pancreas since her brother died of pancreatic cancer last year.    4. History of prior stroke. CT of her brain revealed a previous stroke and she was placed on Aspirin 81mg  daily.    5. Right neck node which was swollen and itchy. I really don't palpate anything today but we will check this with an ultrasound of the soft tissue neck.       Past Medical History:  Diagnosis Date   B12 deficiency    Bilateral breast cancer (HCC) 11/22/2007   BRCA2 gene mutation positive in female 11/22/2007  Breast cancer (HCC) 2009   Double Masectomy and lymph node removal bilaterally   Breast cancer 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 lymph node, left (HCC) 11/22/2007   Cervicogenic headache 09/17/2011   Cervicogenic migraine 09/17/2011   Chronic pain disorder    DDD (degenerative disc disease), cervical    DDD (degenerative disc disease), lumbar 01/14/2009    Dyslipidemia 06/07/2023   Fibromyalgia    GERD (gastroesophageal reflux disease)    Hashimoto's disease    History of colonoscopy with polypectomy 02/11/2012   Hyperbilirubinemia 11/16/2022   Hypercholesteremia    Hyperlipidemia    Hypothyroidism 07/07/2021   Irritable bowel syndrome (IBS)    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 discovered incidentally on CT 06/07/2023   Lymphadenopathy of right cervical region 07/04/2024   Enlarged cervical nodes seen on US , up to 2.8 cm in diameter     Mass of soft tissue of chest 09/03/2022   Multinodular goiter 07/07/2021   Neuropathy 04/25/2008   Osteoarthritis 12/13/2006   Osteoporosis    Precancerous skin lesion 12/13/2018   Sleep apnea treated with continuous positive airway pressure (CPAP) 05/27/2023   Syncope and collapse 06/07/2023   Thyroid  disease    Vitamin D deficiency     Past Surgical History:  Procedure Laterality Date   ABDOMINAL HYSTERECTOMY  2009   BREAST RECONSTRUCTION Bilateral 2010   DILATATION AND CURETTAGE/HYSTEROSCOPY WITH MINERVA     FUNCTIONAL ENDOSCOPIC SINUS SURGERY  12/24/2022   Septoplasty Submucosal Resectioning of Inferior Turbinates   LEG SURGERY     MASTECTOMY Bilateral 2009   REPLACEMENT TOTAL KNEE Bilateral    SALPINGOOPHORECTOMY Bilateral     Family History  Problem Relation Age of Onset   Colon cancer Mother 64   Multiple myeloma Mother 61   Prostate cancer Father 49   Breast cancer Sister 4   Cancer Brother 65       oral   Breast cancer Brother 22   Breast cancer Paternal Grandmother 55   Skin cancer Maternal Aunt        70s   Colon cancer Maternal Uncle 76   Stomach cancer Maternal Uncle 18   Breast cancer Paternal Aunt 69   Melanoma Cousin 23       maternal    Social History:  reports that she has never smoked. She has never used smokeless tobacco. She reports that she does not currently use alcohol. She reports that she does not use  drugs.  Allergies:  Allergies  Allergen Reactions   Sulfamethoxazole-Trimethoprim Hives    Facial swelling with hives  Other Reaction(s): Rash on face    Medications: I have reviewed the patient's current medications.  The PMH, PSH, Medications, Allergies, and SH were reviewed and updated.  ROS: Constitutional: Negative for fever, weight loss and weight gain. Cardiovascular: Negative for chest pain and dyspnea on exertion. Respiratory: Is not experiencing shortness of breath at rest. Gastrointestinal: Negative for nausea and vomiting. Neurological: Negative for headaches. Psychiatric: The patient is not nervous/anxious  There were no vitals taken for this visit. There is no height or weight on file to calculate BMI.  PHYSICAL EXAM:  Exam: General: Well-developed, well-nourished Respiratory Respiratory effort: Equal inspiration and expiration without stridor Cardiovascular Peripheral Vascular: Warm extremities with equal color/perfusion Eyes: No nystagmus with equal extraocular motion bilaterally Neuro/Psych/Balance: Patient oriented to person, place, and time; Appropriate mood and affect; Gait is intact with no imbalance; Cranial nerves I-XII are intact Head and Face Inspection: Normocephalic and atraumatic without mass  or lesion Palpation: Facial skeleton intact without bony stepoffs Salivary Glands: No mass or tenderness Facial Strength: Facial motility symmetric and full bilaterally ENT Pinna: External ear intact and fully developed External canal: Canal is patent with intact skin Tympanic Membrane: Clear and mobile External Nose: No scar or anatomic deformity Internal Nose: Septum is relatively straight. No polyp, minimal mucus and purulence posterior nasal passage/at the choana. Mucosal edema and erythema present.  Bilateral inferior turbinate hypertrophy.  Lips, Teeth, and gums: Mucosa and teeth intact and viable TMJ: No pain to palpation with full mobility Oral  cavity/oropharynx: No erythema or exudate, no lesions present Nasopharynx: No mass or lesion with intact mucosa Hypopharynx: Intact mucosa without pooling of secretions Larynx Glottic: Full true vocal cord mobility without lesion or mass Supraglottic: Normal appearing epiglottis and AE folds Interarytenoid Space: Moderate pachydermia&edema Subglottic Space: Patent without lesion or edema Neck Neck and Trachea: Midline trachea without mass or lesion Thyroid : No mass or nodularity Lymphatics: No lymphadenopathy  Procedure: Preoperative diagnosis: cervical lymphadenopathy  Postoperative diagnosis:   Same  Procedure: Flexible fiberoptic laryngoscopy  Surgeon: Elena Larry, MD  Anesthesia: Topical lidocaine  and Afrin Complications: None Condition is stable throughout exam  Indications and consent:  The patient presents to the clinic with above symptoms. Indirect laryngoscopy view was incomplete. Thus it was recommended that they undergo a flexible fiberoptic laryngoscopy. All of the risks, benefits, and potential complications were reviewed with the patient preoperatively and verbal informed consent was obtained.  Procedure: The patient was seated upright in the clinic. Topical lidocaine  and Afrin were applied to the nasal cavity. After adequate anesthesia had occurred, I then proceeded to pass the flexible telescope into the nasal cavity. The nasal cavity was patent without rhinorrhea or polyp. The nasopharynx was also patent without mass or lesion. The base of tongue was visualized and was normal. There were no signs of pooling of secretions in the piriform sinuses. The true vocal folds were mobile bilaterally. There were no signs of glottic or supraglottic mucosal lesion or mass. There was moderate interarytenoid pachydermia and post cricoid edema. The telescope was then slowly withdrawn and the patient tolerated the procedure throughout.   Studies Reviewed: CT neck  07/13/24 FINDINGS: CT NECK   Limited Intracranial and Visualized Orbits: Negative.   Mastoids and Visualized Paranasal Sinuses: Clear.   Pharynx and Larynx: Normal. No mass or edema.   Salivary Glands: No inflammation, mass, or calculus.   Thyroid : Normal.   Lymph Nodes: Enlarged right cervical level II lymph nodes, measuring up to 1.9 x 0.8 cm (series 3, image 25). Mildly prominent, although not pathologically enlarged left-sided lymph nodes measuring up to 1.2 x 0.6 cm (series 3, image 31).   Vascular: Negative.   Musculoskeletal: No acute osseous findings.   Other: None.   CT CHEST   Cardiovascular: No significant vascular findings. Normal heart size. No pericardial effusion.   Mediastinum/Nodes: No enlarged mediastinal, hilar, or axillary lymph nodes. Thyroid  gland, trachea, and esophagus demonstrate no significant findings.   Lungs/Pleura: Lungs are clear. No pleural effusion or pneumothorax.   Upper Abdomen: No acute abnormality.  Hepatic steatosis.   Musculoskeletal: Status post bilateral mastectomy. No acute osseous findings.   IMPRESSION: 1. Enlarged right cervical level II lymph nodes, measuring up to 1.9 x 0.8 cm. Mildly prominent, although not pathologically enlarged left-sided lymph nodes measuring up to 1.2 x 0.6 cm. Findings are nonspecific and may be reactive, although lymphoproliferative disorder or metastatic disease is not excluded. Consider tissue sampling  or PET-CT. 2. No evidence of lymphadenopathy or metastatic disease in the chest. 3. Status post bilateral mastectomy. 4. Hepatic steatosis.  PET/CT 08/28/24 1. Low level hypermetabolism associated with the right-sided level II cervical lymph nodes identified on previous neck CT. Lymph nodes are partially obscured by beam hardening artifact from dental fillings, but dominant right-sided node is similar in size to prior. 2. Tiny focus of mucosal hypermetabolism in the  posterior nasopharynx. Correlation with direct visualization recommended. 3. No evidence for hypermetabolic metastatic disease in the chest, abdomen, or pelvis. 4.  Aortic Atherosclerosis (ICD10-I70.0).  Pathology FNA of the cervical node FINAL MICROSCOPIC DIAGNOSIS:  - No malignant cells identified  - Benign lymphocytes    Assessment/Plan: No diagnosis found.   Assessment and Plan Assessment & Plan Enlarged right cervical lymph node, pending biopsy Enlarged right cervical lymph node with history of breast cancer, fluctuating size. CT showed R level II 1.9 cm node, ultrasound showed indistinct margins. Nodes not pathologically enlarged by CT criteria. Biopsy needed for evaluation. - Proceed with ordered biopsy of the lymph node. - If biopsy normal, consider repeat imaging in a few months. - If nodes grow or persist, consider excisional biopsy.  Chronic sinus drainage/sinusitis noted on exam Chronic sinus drainage post-sinus surgery, managed with neti pot. Recent cold reported, drainage noted during passage of the scope today - Prescribe Augmentin  and steroids for sinus drainage.      Thank you for allowing me to participate in the care of this patient. Please do not hesitate to contact me with any questions or concerns.   Elena Larry, MD Otolaryngology [facility] Phone: [phone] Fax: [phone]    08/31/2024, 2:52 PM

## 2024-09-01 ENCOUNTER — Telehealth (INDEPENDENT_AMBULATORY_CARE_PROVIDER_SITE_OTHER): Payer: Self-pay

## 2024-09-01 DIAGNOSIS — Z881 Allergy status to other antibiotic agents status: Secondary | ICD-10-CM | POA: Diagnosis not present

## 2024-09-01 DIAGNOSIS — M797 Fibromyalgia: Secondary | ICD-10-CM | POA: Diagnosis not present

## 2024-09-01 DIAGNOSIS — M5430 Sciatica, unspecified side: Secondary | ICD-10-CM | POA: Diagnosis not present

## 2024-09-01 DIAGNOSIS — Z8589 Personal history of malignant neoplasm of other organs and systems: Secondary | ICD-10-CM | POA: Diagnosis not present

## 2024-09-01 DIAGNOSIS — G4733 Obstructive sleep apnea (adult) (pediatric): Secondary | ICD-10-CM | POA: Diagnosis not present

## 2024-09-01 DIAGNOSIS — E785 Hyperlipidemia, unspecified: Secondary | ICD-10-CM | POA: Diagnosis not present

## 2024-09-01 DIAGNOSIS — E669 Obesity, unspecified: Secondary | ICD-10-CM | POA: Diagnosis not present

## 2024-09-01 DIAGNOSIS — Z818 Family history of other mental and behavioral disorders: Secondary | ICD-10-CM | POA: Diagnosis not present

## 2024-09-01 DIAGNOSIS — G90A Postural orthostatic tachycardia syndrome (POTS): Secondary | ICD-10-CM | POA: Diagnosis not present

## 2024-09-01 DIAGNOSIS — I471 Supraventricular tachycardia, unspecified: Secondary | ICD-10-CM | POA: Diagnosis not present

## 2024-09-01 DIAGNOSIS — Z8673 Personal history of transient ischemic attack (TIA), and cerebral infarction without residual deficits: Secondary | ICD-10-CM | POA: Diagnosis not present

## 2024-09-01 DIAGNOSIS — Z809 Family history of malignant neoplasm, unspecified: Secondary | ICD-10-CM | POA: Diagnosis not present

## 2024-09-01 DIAGNOSIS — N182 Chronic kidney disease, stage 2 (mild): Secondary | ICD-10-CM | POA: Diagnosis not present

## 2024-09-01 DIAGNOSIS — K760 Fatty (change of) liver, not elsewhere classified: Secondary | ICD-10-CM | POA: Diagnosis not present

## 2024-09-01 DIAGNOSIS — E034 Atrophy of thyroid (acquired): Secondary | ICD-10-CM | POA: Diagnosis not present

## 2024-09-01 DIAGNOSIS — R32 Unspecified urinary incontinence: Secondary | ICD-10-CM | POA: Diagnosis not present

## 2024-09-01 DIAGNOSIS — Z882 Allergy status to sulfonamides status: Secondary | ICD-10-CM | POA: Diagnosis not present

## 2024-09-01 DIAGNOSIS — R7303 Prediabetes: Secondary | ICD-10-CM | POA: Diagnosis not present

## 2024-09-01 DIAGNOSIS — H269 Unspecified cataract: Secondary | ICD-10-CM | POA: Diagnosis not present

## 2024-09-01 DIAGNOSIS — Z7982 Long term (current) use of aspirin: Secondary | ICD-10-CM | POA: Diagnosis not present

## 2024-09-01 DIAGNOSIS — K219 Gastro-esophageal reflux disease without esophagitis: Secondary | ICD-10-CM | POA: Diagnosis not present

## 2024-09-01 DIAGNOSIS — K589 Irritable bowel syndrome without diarrhea: Secondary | ICD-10-CM | POA: Diagnosis not present

## 2024-09-01 NOTE — Telephone Encounter (Signed)
 LVM regarding encounter needing to be put in.

## 2024-09-01 NOTE — Telephone Encounter (Signed)
 Patient called in concerned about upcoming surgery, and who it would be with since Dr. Soldatova is no longer with [facility].  She stated that Dr. Soldatova and her oncologist spoke regarding patient plan / treatment.  I asked the patient to call her oncologist and ask that they put notes in her chart regarding what was discussed so that the new provider taking over her care would be on the same page.   Also asked [facility] CMA) to contact that provider as well just to make sure the request for notes regarding the conversation between Dr. Soldatova and them were put in her chart.  Patient asked that when the information is received that the ENT provider taking over her care give her a call and/or schedule appointment so that treatment/care can go forward.

## 2024-09-06 ENCOUNTER — Ambulatory Visit (INDEPENDENT_AMBULATORY_CARE_PROVIDER_SITE_OTHER): Admitting: Gastroenterology

## 2024-09-06 ENCOUNTER — Encounter: Payer: Self-pay | Admitting: Gastroenterology

## 2024-09-06 VITALS — BP 118/68 | HR 89 | Ht 67.0 in | Wt 246.0 lb

## 2024-09-06 DIAGNOSIS — K641 Second degree hemorrhoids: Secondary | ICD-10-CM

## 2024-09-06 NOTE — Progress Notes (Signed)
 Chief Complaint: FU  Referring Provider:  Clemmie Nest, MD      ASSESSMENT AND PLAN;   #1. Small prolapsed internal hemorrhoid on rectal exam today  #2. GERD with H/O HH with occ dysphagia. Neg prev Bx for EoE/Barrett's.  #3. IBS with alternating diarrhea and constipation.  #4 FH colon Ca (mom at age 63). H/O colonic polyps. FH- pancreatic Ca (brother age 75). Neg MRCP 06/2023    Plan: - Use Preparation H 1 twice daily after the bowel movement for 7 to 10 days as needed. - Rpt colon due 03/2027. - FU as needed.  HPI:    Laura Hill is a 63 y.o. female  With history of breast cancer (BRCA 2+, s/p B/L mastectomy, hysterectomy), fibromyalgia, GERD, IC, IBS, obesity, HLD, hypothyroidism, chronic headaches, OSA  Felt a lump in the right rectum.  Denies having any melena or hematochezia.  No rectal pain.  Has IBS with alternating diarrhea and constipation.  On rectal exam today-small prolapsed internal hemorrhoid.  Nontender.  Heme-negative stools  She also had a PET scan for cervical lymphadenopathy on August 29, 2024 which was unremarkable for any GI hypermetabolic lesions.  Longstanding history of gastroesophageal reflux, better with omeprazole.  No further dysphagia after dilation  Family history of colon cancer -Mom at age 54, ampularry Ca at 48, MM  Braca2 +.  Lynch was negative.  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  MRI did not show any evidence of fistula.  She is not having frequent UTIs.  No history of pneumaturia.  She also had fatty liver on CT Abdo/pelvis.  Normal LFTs.  No alcohol.  Wt Readings from Last 3 Encounters:  09/06/24 246 lb (111.6 kg)  08/29/24 242 lb (109.8 kg)  08/09/24 252 lb (114.3 kg)     Past GI work-up   RUQ US  05/2023: neg  MRCP 06/25/2023 1. No acute abnormality in the abdomen. 2. No biliary ductal dilation.  Colonoscopy 12/2021 - One 6 mm polyp in the proximal transverse colon, removed  with a cold snare. Resected and retrieved. - Diverticulosis in the sigmoid colon. - Non-bleeding internal hemorrhoids\- Bx: SSA - Rpt 5 yrs   EGD 12/2021 - Minimal hiatal hernia. - Mild gastritis. - Gastric polyps. - S/P esophageal dilatation  CT AP with contrast 10/22/2021 1. Extensive sigmoid colonic diverticulosis with a portion of sigmoid colon intimately associated with the left vaginal cuff, with gas and trace hyperdense material in the vagina. 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 Consider more definitive characterization with CT abdomen and pelvis with IV and per rectal contrast material. No evidence of acute diverticulitis. 2. Diffuse hepatic steatosis. 3.  Aortic Atherosclerosis (ICD10-I70.0).  MRI: Pelvis 11/07/2021 IMPRESSION: Sigmoid diverticulosis, without evidence of active diverticulitis.   No definite evidence of colovaginal fistula. If there is persistent clinical suspicion, consider pelvis CT with rectal contrast administration due to its higher sensitivity.     Past GI work-up (Dr Odis Girt at [facility] [place] ): Colonoscopy 11/27/2018 ([facility]): Mild diverticulosis, grade 1 internal hemorrhoids, strong family history of colon cancer.  Repeat in 3 years. Colonoscopy 07/13/2017 grade 2 internal hemorrhoids s/p banding, mild diverticulosis, neg colon biopsies. Colonoscopy 09/03/2015 mild diverticulosis, internal hemorrhoids. Colonoscopy 02/2013 8 mm cecal polyp tubulovillous adenoma, ascending colon 6 mm polyp tubular adenoma. EGD 07/13/2017: Mild gastritis, fundic gland polyps.  Dilation empiric 54Fr (better) EGD 08/24/2015: Empiric 54 Fr, no Barrett's or EOE.  No obvious hiatal hernia. EGD 02/22/2013 LA grade a  EE, 54Fr Past Medical History:  Diagnosis Date   B12 deficiency    Bilateral breast cancer (HCC) 11/22/2007   BRCA2 gene mutation positive in female 11/22/2007   Breast cancer (HCC) 2009   Double Masectomy and lymph node removal bilaterally    Breast cancer 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 lymph node, left (HCC) 11/22/2007   Cervicogenic headache 09/17/2011   Cervicogenic migraine 09/17/2011   Chronic pain disorder    DDD (degenerative disc disease), cervical    DDD (degenerative disc disease), lumbar 01/14/2009   Dyslipidemia 06/07/2023   Fibromyalgia    GERD (gastroesophageal reflux disease)    Hashimoto's disease    History of colonoscopy with polypectomy 02/11/2012   Hyperbilirubinemia 11/16/2022   Hypercholesteremia    Hyperlipidemia    Hypothyroidism 07/07/2021   Irritable bowel syndrome (IBS)    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 discovered incidentally on CT 06/07/2023   Lymphadenopathy of right cervical region 07/04/2024   Enlarged cervical nodes seen on US , up to 2.8 cm in diameter     Mass of soft tissue of chest 09/03/2022   Multinodular goiter 07/07/2021   Neuropathy 04/25/2008   Osteoarthritis 12/13/2006   Osteoporosis    Precancerous skin lesion 12/13/2018   Sleep apnea treated with continuous positive airway pressure (CPAP) 05/27/2023   Syncope and collapse 06/07/2023   Thyroid  disease    Vitamin D deficiency     Past Surgical History:  Procedure Laterality Date   ABDOMINAL HYSTERECTOMY  2009   BREAST RECONSTRUCTION Bilateral 2010   DILATATION AND CURETTAGE/HYSTEROSCOPY WITH MINERVA     FUNCTIONAL ENDOSCOPIC SINUS SURGERY  12/24/2022   Septoplasty Submucosal Resectioning of Inferior Turbinates   LEG SURGERY     MASTECTOMY Bilateral 2009   REPLACEMENT TOTAL KNEE Bilateral    SALPINGOOPHORECTOMY Bilateral     Family History  Problem Relation Age of Onset   Colon cancer Mother 32   Multiple myeloma Mother 14   Prostate cancer Father 11   Breast cancer Sister 27   Cancer Brother 3       oral   Breast cancer Brother 48   Breast cancer Paternal Grandmother 16   Skin cancer Maternal Aunt        70s   Colon cancer Maternal Uncle 76   Stomach cancer Maternal Uncle  63   Breast cancer Paternal Aunt 30   Melanoma Cousin 46       maternal    Social History   Tobacco Use   Smoking status: Never   Smokeless tobacco: Never  Vaping Use   Vaping status: Never Used  Substance Use Topics   Alcohol use: Not Currently    Comment: rare   Drug use: Never    Current Outpatient Medications  Medication Sig Dispense Refill   aspirin EC 81 MG tablet Take 81 mg by mouth daily. Swallow whole.     Cyanocobalamin  (VITAMIN B12 PO) Take 1 tablet by mouth daily. Strength unknown     dicyclomine (BENTYL) 20 MG tablet Take 20 mg by mouth as directed.     DULoxetine (CYMBALTA) 60 MG capsule Take 60 mg by mouth daily.     HYDROcodone-acetaminophen  (NORCO/VICODIN) 5-325 MG tablet Take 1 tablet by mouth daily as needed for moderate pain or severe pain.     levothyroxine  (SYNTHROID ) 88 MCG tablet Take 1 tablet (88 mcg total) by mouth daily before breakfast. 90 tablet 0   omeprazole (PRILOSEC) 40 MG capsule Take 40 mg by mouth daily.  pregabalin (LYRICA) 100 MG capsule Take 100 mg by mouth daily.     rosuvastatin (CRESTOR) 40 MG tablet Take 40 mg by mouth daily.     tiZANidine (ZANAFLEX) 4 MG tablet Take 4 mg by mouth daily.     VITAMIN D PO Take 1 tablet by mouth daily. Strength unknown     methylPREDNISolone  (MEDROL  DOSEPAK) 4 MG TBPK tablet Take with signs of chronic sinusitis and take as directed (Patient not taking: Reported on 09/06/2024) 1 each 1   Semaglutide, 1 MG/DOSE, (OZEMPIC, 1 MG/DOSE,) 4 MG/3ML SOPN Inject 1 mg into the skin once a week. (Patient not taking: Reported on 09/06/2024)     No current facility-administered medications for this visit.    Allergies  Allergen Reactions   Sulfamethoxazole-Trimethoprim Hives    Facial swelling with hives  Other Reaction(s): Rash on face   ROS: Psychiatric/Behavioral: has anxiety or depression     Physical Exam:    BP 118/68   Pulse 89   Ht 5' 7 (1.702 m)   Wt 246 lb (111.6 kg)   BMI 38.53 kg/m   Wt Readings from Last 3 Encounters:  09/06/24 246 lb (111.6 kg)  08/29/24 242 lb (109.8 kg)  08/09/24 252 lb (114.3 kg)   Constitutional:  Well-developed, in no acute distress. Psychiatric: Normal mood and affect. Behavior is normal. Abdominal: Soft, nondistended. Nontender. Bowel sounds active throughout. There are no masses palpable. No hepatomegaly. Rectal: In presence of CMA.  Small internal prolapsed right anterior hemorrhoid.  Heme-negative stools.  Nontender. Neurological: Alert and oriented to person place and time. Skin: Skin is warm and dry. No rashes noted.   Anselm Bring, MD 09/06/2024, 11:22 AM  Cc: Clemmie Nest, MD  o

## 2024-09-14 ENCOUNTER — Telehealth (INDEPENDENT_AMBULATORY_CARE_PROVIDER_SITE_OTHER): Payer: Self-pay

## 2024-09-14 ENCOUNTER — Other Ambulatory Visit: Payer: Self-pay | Admitting: Hematology and Oncology

## 2024-09-14 NOTE — Telephone Encounter (Signed)
 Called and spoke with husband about canceling the appointment for tomorrow until we can get all the notes from oncology and have a doctor review them.

## 2024-09-14 NOTE — Telephone Encounter (Signed)
 Called Pt and LVM to give us  a call back that we are canceling her Appt tomorrow waiting to get notes from oncology.

## 2024-09-14 NOTE — Telephone Encounter (Signed)
 Called Pt's oncology talked to them about what the conversations were between Soldatova and the Oncologist. Eleanor bach NP stated that Pt has a history of breast cancer and a lymph node showed up on her PET scan, when they tried to get it biopsied on the place refused because they had already done a biopsy once before. So the next step that was talked about was going in and doing surgery to remove the lump.

## 2024-09-15 ENCOUNTER — Ambulatory Visit (INDEPENDENT_AMBULATORY_CARE_PROVIDER_SITE_OTHER): Admitting: Otolaryngology

## 2024-09-22 ENCOUNTER — Telehealth (INDEPENDENT_AMBULATORY_CARE_PROVIDER_SITE_OTHER): Payer: Self-pay | Admitting: Otolaryngology

## 2024-09-22 NOTE — Telephone Encounter (Signed)
 I talked to her on the phone and discussed her situation.  She had a fine-needle aspiration which showed lymphocytes that were benign.  She does now want to proceed with an excisional biopsy as her oncologist seems to be feel it is indicated.  I will get her back in with a another doctor here to discuss that procedure and I have notified the staff here at the office to get her a ASAP appointment

## 2024-09-27 ENCOUNTER — Ambulatory Visit (INDEPENDENT_AMBULATORY_CARE_PROVIDER_SITE_OTHER)

## 2024-09-27 ENCOUNTER — Encounter (INDEPENDENT_AMBULATORY_CARE_PROVIDER_SITE_OTHER): Payer: Self-pay

## 2024-09-27 VITALS — BP 112/74 | HR 82 | Ht 67.0 in | Wt 235.0 lb

## 2024-09-27 DIAGNOSIS — L259 Unspecified contact dermatitis, unspecified cause: Secondary | ICD-10-CM

## 2024-09-27 DIAGNOSIS — Z853 Personal history of malignant neoplasm of breast: Secondary | ICD-10-CM

## 2024-09-27 DIAGNOSIS — R590 Localized enlarged lymph nodes: Secondary | ICD-10-CM

## 2024-09-27 DIAGNOSIS — M542 Cervicalgia: Secondary | ICD-10-CM | POA: Diagnosis not present

## 2024-09-27 DIAGNOSIS — R221 Localized swelling, mass and lump, neck: Secondary | ICD-10-CM

## 2024-09-27 NOTE — Progress Notes (Signed)
 Dear Dr. Clemmie, Here is my assessment for our mutual patient, Laura Hill. Thank you for allowing me the opportunity to care for your patient. Please do not hesitate to contact me should you have any other questions. Sincerely, Dr. Penne Croak  Otolaryngology Clinic Note Referring provider: Dr. Clemmie HPI:  Discussed the use of AI scribe software for clinical note transcription with the patient, who gave verbal consent to proceed.  History of Present Illness Laura Hill is a 63 year old female who presents with recurrent lymphadenopathy and itching. She was referred by her oncologist for further evaluation of lymphadenopathy.  Recurrent cervical lymphadenopathy - Recurrent right-sided lymphadenopathy near the mandible since August 2025 - Significant enlargement occurs every few weeks, then subsides - Associated with concern due to family history of cancer - Lymph node sometimes causes discomfort in the nasopharynx - Ultrasound, CT, and PET scan performed as part of evaluation - PET scan showed hypermetabolism in the area - Biopsy of the lymph node returned negative results - this was from level IV not level II  Localized pruritus and skin changes - Recurrent located over inferior aspect of SCM. - Associated skin changes described as unusual and persistent - Itching and skin changes have been noticed by her daughter - Benadryl has been tried without relief  Musculoskeletal pain - Constant pain in the right trapezius muscle, worsening throughout the day - History of degenerative disc disease, typically causing back pain rather than neck pain - Previous physical therapy for cervical neck pain  Sinonasal history and interventions - History of sinus surgery, including septoplasty - Daily use of neti pot for sinus drainage - Previously prescribed Augmentin  despite absence of sinus infection    Independent Review of Additional Tests or Records:  Reviewed external note from  referring PCP, Burgart,describing relevant history incorporated into today's evaluation. I personally reviewed and interpreted PET CT - minimal uptake of right level II lymphnode, mild uptake along midline nasopharynx.   PMH/Meds/All/SocHx/FamHx/ROS:   Past Medical History:  Diagnosis Date   B12 deficiency    Bilateral breast cancer (HCC) 11/22/2007   BRCA2 gene mutation positive in female 11/22/2007   Breast cancer (HCC) 2009   Double Masectomy and lymph node removal bilaterally   Breast cancer 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 lymph node, left (HCC) 11/22/2007   Cervicogenic headache 09/17/2011   Cervicogenic migraine 09/17/2011   Chronic pain disorder    DDD (degenerative disc disease), cervical    DDD (degenerative disc disease), lumbar 01/14/2009   Dyslipidemia 06/07/2023   Fibromyalgia    GERD (gastroesophageal reflux disease)    Hashimoto's disease    History of colonoscopy with polypectomy 02/11/2012   Hyperbilirubinemia 11/16/2022   Hypercholesteremia    Hyperlipidemia    Hypothyroidism 07/07/2021   Irritable bowel syndrome (IBS)    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 discovered incidentally on CT 06/07/2023   Lymphadenopathy of right cervical region 07/04/2024   Enlarged cervical nodes seen on US , up to 2.8 cm in diameter     Mass of soft tissue of chest 09/03/2022   Multinodular goiter 07/07/2021   Neuropathy 04/25/2008   Osteoarthritis 12/13/2006   Osteoporosis    Precancerous skin lesion 12/13/2018   Sleep apnea treated with continuous positive airway pressure (CPAP) 05/27/2023   Syncope and collapse 06/07/2023   Thyroid  disease    Vitamin D deficiency      Past Surgical History:  Procedure Laterality Date   ABDOMINAL HYSTERECTOMY  2009   BREAST RECONSTRUCTION  Bilateral 2010   DILATATION AND CURETTAGE/HYSTEROSCOPY WITH MINERVA     FUNCTIONAL ENDOSCOPIC SINUS SURGERY  12/24/2022   Septoplasty Submucosal Resectioning of Inferior  Turbinates   LEG SURGERY     MASTECTOMY Bilateral 2009   REPLACEMENT TOTAL KNEE Bilateral    SALPINGOOPHORECTOMY Bilateral     Family History  Problem Relation Age of Onset   Colon cancer Mother 54   Multiple myeloma Mother 2   Prostate cancer Father 79   Breast cancer Sister 39   Cancer Brother 4       oral   Breast cancer Brother 76   Breast cancer Paternal Grandmother 77   Skin cancer Maternal Aunt        70s   Colon cancer Maternal Uncle 76   Stomach cancer Maternal Uncle 6   Breast cancer Paternal Aunt 63   Melanoma Cousin 18       maternal     Social Connections: Not on file      Current Outpatient Medications:    aspirin EC 81 MG tablet, Take 81 mg by mouth daily. Swallow whole., Disp: , Rfl:    Cyanocobalamin  (VITAMIN B12 PO), Take 1 tablet by mouth daily. Strength unknown, Disp: , Rfl:    dicyclomine (BENTYL) 20 MG tablet, Take 20 mg by mouth as directed., Disp: , Rfl:    DULoxetine (CYMBALTA) 60 MG capsule, Take 60 mg by mouth daily., Disp: , Rfl:    HYDROcodone-acetaminophen  (NORCO/VICODIN) 5-325 MG tablet, Take 1 tablet by mouth daily as needed for moderate pain or severe pain., Disp: , Rfl:    levothyroxine  (SYNTHROID ) 88 MCG tablet, Take 1 tablet (88 mcg total) by mouth daily before breakfast., Disp: 90 tablet, Rfl: 0   omeprazole (PRILOSEC) 40 MG capsule, Take 40 mg by mouth daily., Disp: , Rfl:    pregabalin (LYRICA) 100 MG capsule, Take 100 mg by mouth daily., Disp: , Rfl:    rosuvastatin (CRESTOR) 40 MG tablet, Take 40 mg by mouth daily., Disp: , Rfl:    Semaglutide, 1 MG/DOSE, (OZEMPIC, 1 MG/DOSE,) 4 MG/3ML SOPN, Inject 1 mg into the skin once a week., Disp: , Rfl:    tiZANidine (ZANAFLEX) 4 MG tablet, Take 4 mg by mouth daily., Disp: , Rfl:    VITAMIN D PO, Take 1 tablet by mouth daily. Strength unknown, Disp: , Rfl:    methylPREDNISolone  (MEDROL  DOSEPAK) 4 MG TBPK tablet, Take with signs of chronic sinusitis and take as directed (Patient not taking:  Reported on 09/27/2024), Disp: 1 each, Rfl: 1   Physical Exam:   BP 112/74 (BP Location: Left Arm, Patient Position: Sitting, Cuff Size: Large)   Pulse 82   Ht 5' 7 (1.702 m)   Wt 235 lb (106.6 kg)   SpO2 97%   BMI 36.81 kg/m   The patient was awake, alert, and appropriate. The external ears were inspected, and otoscopy was performed to evaluate the external auditory canals and tympanic membranes. The nasal cavity and septum were examined for mucosal changes, obstruction, or discharge. The oral cavity and oropharynx were inspected for mucosal lesions, infection, or tonsillar hypertrophy. The neck was palpated for lymphadenopathy, thyroid  abnormalities, or other masses. Cranial nerve function was grossly intact.  Pertinent Findings: Physical Exam HEENT: Nasal cavity normal with mucus and drainage present. Sinuses clear with no discharge. Nasopharynx normal. Posterior nasal drainage present. NECK: Neck muscles tense.   Seprately Identifiable Procedures:  I personally ordered, reviewed and interpreted the following with the patient today  Given the patient's symptoms and incomplete visualization of critical sinonasal areas with anterior rhinoscopy, a separately performed diagnostic nasal endoscopy procedure is indicated for a complete rhinologic evaluation per American Rhinologic Society recommendations (https://[email or web address]-rhinologic.org/position-statements)  I personally ordered, reviewed and interpreted the following with the patient today  Procedure Note Diagnostic Nasal Endoscopy CPT CODE -- 68768 - Mod 25  Prior to initiating any procedures, risks/benefits/alternatives were explained to the patient and verbal consent obtained.  Pre-procedure diagnosis: Concern for mass Post-procedure diagnosis: same Indication: See pre-procedure diagnosis and physical exam above Complications: None apparent EBL: 0 mL Anesthesia: Lidocaine  4% and topical decongestant was topically sprayed in  each nasal cavity  Description of Procedure:  Patient was identified. A flexible fiberoptic endoscope was utilized to evaluate the sinonasal cavities, mucosa, sinus ostia and turbinates and septum.  Overall, signs of mucosal inflammation are not noted.  Also noted are minmal drainage.  No mucopurulence, polyps, or masses noted.   Right Middle meatus: post surgical changes Right SE Recess: post surgical changes Left MM: post surgical changes Left SE Recess: post surgical changes Photodocumentation was obtained.              Impression & Plans:  Laura Hill is a 63 y.o. female  1. Lymphadenopathy of right cervical region   2. Cervical lymphadenopathy   3. History of breast cancer     - Findings and diagnoses discussed in detail with the patient. - Risks, benefits, and alternatives were reviewed. Through shared decision making, the patient elects to proceed with below. Assessment & Plan Right level II cervical lymphadenopathy Intermittent enlargement and itching of the right level II cervical lymph node. Previous biopsy negative for malignancy. PET scan showed hypermetabolic uptake, but biopsy was at wrong level. Fluctuating size suggests reactive lymphadenopathy over malignancy. - Ordered FNA biopsy at level II behind the mandible. - Consider core biopsy if FNA is negative and further evaluation is needed.  Contact dermatitis of the neck Intermittent itching and redness likely due to contact dermatitis. Previous Benadryl cream ineffective. - Use Benadryl cream for symptomatic relief. - Consider antihistamines like Benadryl for itching.  Cervical muscle pain Chronic right-sided cervical muscle pain, possibly due to strain or spasm. Previous physical therapy beneficial. - declined physical therapy exercises for cervical neck pain.  - Orders placed:  Orders Placed This Encounter  Procedures   US  FNA BX THYROID  1ST LESION AFIRMA   - Medications  prescribed/continued/adjusted: No orders of the defined types were placed in this encounter.  - Education materials provided to the patient. - Follow up: after biopsy, discussed core biopsy and cervical biopsy. Patient instructed to return sooner or go to the ED if new/worsening symptoms develop.   Thank you for allowing me the opportunity to care for your patient. Please do not hesitate to contact me should you have any other questions.  Sincerely, Penne Croak, DO Otolaryngologist (ENT) [facility] Phone: [phone] Fax: [phone]  09/27/2024, 5:34 PM

## 2024-10-04 NOTE — Addendum Note (Signed)
 Addended by: ANICE RIIS on: 10/04/2024 01:49 PM   Modules accepted: Orders

## 2024-10-05 ENCOUNTER — Encounter (HOSPITAL_COMMUNITY): Payer: Self-pay

## 2024-10-05 NOTE — Progress Notes (Addendum)
 Spoke to [facility], MD on the phone. Reassured the biopsy was of the node in question. Okay to cancel request for now. Spoke to the patient about close observation. She requested that I discuss with her oncologist. Messaged Dr. Cornelius.     Per [name], MD  : No repeat Bx. Pls decline / defer request. Thanks       __________________________________________ [name], MD  Billiejo Sorto Cc: Anice Riis, DO Dr Anice. We do a thorough sonographic evaluation during these and Bx the approved target. In short, the target was biopsied as intended. Pls let me know if you have any additional Qs.  JM, MD       Previous Messages    ----- Message ----- From: Silvana Holecek Sent: 10/04/2024   3:11 PM EST To: Simmonds Hughes, MD Subject: FW: US  FNA BX THYROID  1ST LESION MARYAGNES         ----- Message ----- From: Jenna Cordella LABOR, MD Sent: 10/04/2024   3:05 PM EST To: Jayde Mcallister Subject: RE: US  FNA BX THYROID  1ST LESION AFIRMA        This should be sent to Dr Hughes for review since he is the one who performed the previous biopsy.  His note states Lv 2. ----- Message ----- From: Loreal Schuessler Sent: 10/04/2024  10:48 AM EST To: Thatiana Renbarger; Ir Procedure Requests Subject: US  FNA BX THYROID  1ST LESION AFIRMA            Procedure : US  FNA BX THYROID  1ST LESION AFIRMA  Reason : right level II lymphadenopathy Dx: Lymphadenopathy of right cervical region [R59.0 (ICD-10-CM)]  Asked MD if this was a repeat from 08/10/24 US  FNA soft tissue , per provider : this should be a right level II LN biopsy. Louna said that they did a level IV biopsy. I want the biopsy to be from the mass seen on imaging (level II)      History: US  soft tissue head and neck , mri abd mrcp w/wo, CT soft tissue neck w/ , CT Chest w/ , US  FNA SOFT TISSUE, NM Pet skull base to thigh  Provider : Anice Riis, DO  Contact : [phone]

## 2024-10-10 ENCOUNTER — Ambulatory Visit: Admitting: Gastroenterology

## 2024-11-21 ENCOUNTER — Ambulatory Visit (INDEPENDENT_AMBULATORY_CARE_PROVIDER_SITE_OTHER)

## 2024-11-21 ENCOUNTER — Encounter (INDEPENDENT_AMBULATORY_CARE_PROVIDER_SITE_OTHER): Payer: Self-pay

## 2024-11-21 VITALS — BP 111/74 | HR 96 | Wt 235.0 lb

## 2024-11-21 DIAGNOSIS — Z853 Personal history of malignant neoplasm of breast: Secondary | ICD-10-CM

## 2024-11-21 DIAGNOSIS — R590 Localized enlarged lymph nodes: Secondary | ICD-10-CM | POA: Diagnosis not present

## 2024-11-21 NOTE — Progress Notes (Signed)
 Dear Dr. Clemmie, Here is my assessment for our mutual patient, Laura Hill. Thank you for allowing me the opportunity to care for your patient. Please do not hesitate to contact me should you have any other questions. Sincerely, Dr. Penne Croak  Otolaryngology Clinic Note Referring provider: Dr. Clemmie HPI:  Discussed the use of AI scribe software for clinical note transcription with the patient, who gave verbal consent to proceed.  History of Present Illness Laura Hill is a 64 year old female who presents with recurrent lymphadenopathy and itching. She was referred by her oncologist for further evaluation of lymphadenopathy.  Recurrent cervical lymphadenopathy - Recurrent right-sided lymphadenopathy near the mandible since August 2025 - Significant enlargement occurs every few weeks, then subsides - Associated with concern due to family history of cancer - Lymph node sometimes causes discomfort in the nasopharynx - Ultrasound, CT, and PET scan performed as part of evaluation - PET scan showed hypermetabolism in the area - Biopsy of the lymph node returned negative results - this was from level IV not level II  Localized pruritus and skin changes - Recurrent located over inferior aspect of SCM. - Associated skin changes described as unusual and persistent - Itching and skin changes have been noticed by her daughter - Benadryl has been tried without relief  Musculoskeletal pain - Constant pain in the right trapezius muscle, worsening throughout the day - History of degenerative disc disease, typically causing back pain rather than neck pain - Previous physical therapy for cervical neck pain  Sinonasal history and interventions - History of sinus surgery, including septoplasty - Daily use of neti pot for sinus drainage - Previously prescribed Augmentin  despite absence of sinus infection   Interval hx 11/21/24 Laura Hill is a 64 year old female with breast cancer who  presents for follow-up of previously identified right cervical lymphadenopathy.  I discussed the case with Dr. Hughes. He reassured me that the lymph node in question was adequately sampled.   Cervical lymphadenopathy - Enlarged right cervical lymph node identified on prior ultrasound and CT imaging, measuring up to 1.9 cm. - Intermittent swelling in the right neck region with recurrence a few times after initial workup. - No neck swelling present for at least five weeks. - No dysphagia or odynophagia.  Recent infectious symptoms - Episode of influenza beginning around Christmas Eve, characterized by fever, myalgias, and significant malaise. - Required several days of bed rest due to severity of symptoms. - Continues to experience residual fatigue. - No current sore throat or other infectious symptoms.  Oncologic history and treatment response - History of breast cancer, currently scheduled for routine oncology follow-up and blood tests on February 6th. - Following chemotherapy, resolution of prior hypoglycemia and recurrent streptococcal pharyngitis. - No episodes of strep throat since completion of chemotherapy.  Independent Review of Additional Tests or Records:  Reviewed external note from referring PCP, Burgart,describing relevant history incorporated into todays evaluation.   Previous PET CT - minimal uptake of right level II lymphnode, mild uptake along midline nasopharynx.   PMH/Meds/All/SocHx/FamHx/ROS:   Past Medical History:  Diagnosis Date   B12 deficiency    Bilateral breast cancer (HCC) 11/22/2007   BRCA2 gene mutation positive in female 11/22/2007   Breast cancer (HCC) 2009   Double Masectomy and lymph node removal bilaterally   Breast cancer 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 lymph node, left (HCC) 11/22/2007   Cervicogenic headache 09/17/2011   Cervicogenic migraine 09/17/2011   Chronic pain disorder    DDD (  degenerative disc disease), cervical    DDD (degenerative  disc disease), lumbar 01/14/2009   Dyslipidemia 06/07/2023   Fibromyalgia    GERD (gastroesophageal reflux disease)    Hashimoto's disease    History of colonoscopy with polypectomy 02/11/2012   Hyperbilirubinemia 11/16/2022   Hypercholesteremia    Hyperlipidemia    Hypothyroidism 07/07/2021   Irritable bowel syndrome (IBS)    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 discovered incidentally on CT 06/07/2023   Lymphadenopathy of right cervical region 07/04/2024   Enlarged cervical nodes seen on US , up to 2.8 cm in diameter     Mass of soft tissue of chest 09/03/2022   Multinodular goiter 07/07/2021   Neuropathy 04/25/2008   Osteoarthritis 12/13/2006   Osteoporosis    Precancerous skin lesion 12/13/2018   Sleep apnea treated with continuous positive airway pressure (CPAP) 05/27/2023   Syncope and collapse 06/07/2023   Thyroid  disease    Vitamin D deficiency      Past Surgical History:  Procedure Laterality Date   ABDOMINAL HYSTERECTOMY  2009   BREAST RECONSTRUCTION Bilateral 2010   DILATATION AND CURETTAGE/HYSTEROSCOPY WITH MINERVA     FUNCTIONAL ENDOSCOPIC SINUS SURGERY  12/24/2022   Septoplasty Submucosal Resectioning of Inferior Turbinates   LEG SURGERY     MASTECTOMY Bilateral 2009   REPLACEMENT TOTAL KNEE Bilateral    SALPINGOOPHORECTOMY Bilateral     Family History  Problem Relation Age of Onset   Colon cancer Mother 46   Multiple myeloma Mother 58   Prostate cancer Father 95   Breast cancer Sister 37   Cancer Brother 20       oral   Breast cancer Brother 22   Breast cancer Paternal Grandmother 50   Skin cancer Maternal Aunt        70s   Colon cancer Maternal Uncle 76   Stomach cancer Maternal Uncle 34   Breast cancer Paternal Aunt 61   Melanoma Cousin 18       maternal     Social Connections: Not on file      Current Outpatient Medications:    aspirin EC 81 MG tablet, Take 81 mg by mouth daily. Swallow whole., Disp: ,  Rfl:    Cyanocobalamin  (VITAMIN B12 PO), Take 1 tablet by mouth daily. Strength unknown, Disp: , Rfl:    dicyclomine (BENTYL) 20 MG tablet, Take 20 mg by mouth as directed., Disp: , Rfl:    DULoxetine (CYMBALTA) 60 MG capsule, Take 60 mg by mouth daily., Disp: , Rfl:    HYDROcodone-acetaminophen  (NORCO/VICODIN) 5-325 MG tablet, Take 1 tablet by mouth daily as needed for moderate pain or severe pain., Disp: , Rfl:    levothyroxine  (SYNTHROID ) 88 MCG tablet, Take 1 tablet (88 mcg total) by mouth daily before breakfast., Disp: 90 tablet, Rfl: 0   omeprazole (PRILOSEC) 40 MG capsule, Take 40 mg by mouth daily., Disp: , Rfl:    pregabalin (LYRICA) 100 MG capsule, Take 100 mg by mouth daily., Disp: , Rfl:    rosuvastatin (CRESTOR) 40 MG tablet, Take 40 mg by mouth daily., Disp: , Rfl:    Semaglutide, 1 MG/DOSE, (OZEMPIC, 1 MG/DOSE,) 4 MG/3ML SOPN, Inject 1 mg into the skin once a week., Disp: , Rfl:    tiZANidine (ZANAFLEX) 4 MG tablet, Take 4 mg by mouth daily., Disp: , Rfl:    VITAMIN D PO, Take 1 tablet by mouth daily. Strength unknown, Disp: , Rfl:    methylPREDNISolone  (MEDROL  DOSEPAK) 4 MG TBPK  tablet, Take with signs of chronic sinusitis and take as directed (Patient not taking: Reported on 11/21/2024), Disp: 1 each, Rfl: 1   Physical Exam:   BP 111/74 (BP Location: Left Arm, Patient Position: Sitting, Cuff Size: Normal)   Pulse 96   Wt 235 lb (106.6 kg)   SpO2 95%   BMI 36.81 kg/m   The patient was awake, alert, and appropriate. The external ears were inspected, and otoscopy was performed to evaluate the external auditory canals and tympanic membranes. The nasal cavity and septum were examined for mucosal changes, obstruction, or discharge. The oral cavity and oropharynx were inspected for mucosal lesions, infection, or tonsillar hypertrophy. The neck was palpated for lymphadenopathy, thyroid  abnormalities, or other masses. Cranial nerve function was grossly intact.  Pertinent  Findings: Physical Exam  HEENT: Pharynx normal. Ears normal. NECK: No cervical adenopathy.  Impression & Plans:  Laura Hill is a 64 y.o. female  1. Lymphadenopathy of right cervical region   2. History of breast cancer    - Findings and diagnoses discussed in detail with the patient. - Risks, benefits, and alternatives were reviewed. Through shared decision making, the patient elects to proceed with below. Assessment & Plan Right level II cervical lymphadenopathy Intermittent enlargement and itching of the right level II cervical lymph node. Previous biopsy negative for malignancy. PET scan showed hypermetabolic uptake. Fluctuating size suggests reactive lymphadenopathy over malignancy.  - Benign exam today  - no palpable cervical lymphadenopathy  Breast cancer history necessitates close surveillance.  - Repeat CT neck after scheduled oncology follow-up, targeting six-month interval from prior imaging. - Follow-up in 2-3 months to review imaging and reassess.  - Orders placed:  No orders of the defined types were placed in this encounter.  - Medications prescribed/continued/adjusted: No orders of the defined types were placed in this encounter.  - Education materials provided to the patient. - Follow up: in 2-3 months after CT neck. Patient instructed to return sooner or go to the ED if new/worsening symptoms develop.  Thank you for allowing me the opportunity to care for your patient. Please do not hesitate to contact me should you have any other questions.  Sincerely, Penne Croak, DO Otolaryngologist (ENT) [facility] Phone: [phone] Fax: [phone]  11/21/2024, 9:45 PM

## 2024-12-18 ENCOUNTER — Ambulatory Visit: Admitting: "Endocrinology

## 2024-12-19 ENCOUNTER — Inpatient Hospital Stay

## 2024-12-19 ENCOUNTER — Other Ambulatory Visit: Payer: Self-pay | Admitting: Oncology

## 2024-12-19 ENCOUNTER — Inpatient Hospital Stay: Admitting: Oncology

## 2024-12-19 VITALS — BP 140/76 | HR 80 | Temp 98.1°F | Resp 18 | Ht 67.0 in | Wt 247.9 lb

## 2024-12-19 DIAGNOSIS — C50012 Malignant neoplasm of nipple and areola, left female breast: Secondary | ICD-10-CM

## 2024-12-19 DIAGNOSIS — R590 Localized enlarged lymph nodes: Secondary | ICD-10-CM

## 2024-12-19 DIAGNOSIS — Z170 Estrogen receptor positive status [ER+]: Secondary | ICD-10-CM

## 2024-12-19 DIAGNOSIS — Z1501 Genetic susceptibility to malignant neoplasm of breast: Secondary | ICD-10-CM

## 2024-12-19 LAB — CBC WITH DIFFERENTIAL (CANCER CENTER ONLY)
Abs Immature Granulocytes: 0.01 10*3/uL (ref 0.00–0.07)
Basophils Absolute: 0.1 10*3/uL (ref 0.0–0.1)
Basophils Relative: 1 %
Eosinophils Absolute: 0.5 10*3/uL (ref 0.0–0.5)
Eosinophils Relative: 7 %
HCT: 41.6 % (ref 36.0–46.0)
Hemoglobin: 13.7 g/dL (ref 12.0–15.0)
Immature Granulocytes: 0 %
Lymphocytes Relative: 29 %
Lymphs Abs: 1.9 10*3/uL (ref 0.7–4.0)
MCH: 27.7 pg (ref 26.0–34.0)
MCHC: 32.9 g/dL (ref 30.0–36.0)
MCV: 84.2 fL (ref 80.0–100.0)
Monocytes Absolute: 0.3 10*3/uL (ref 0.1–1.0)
Monocytes Relative: 5 %
Neutro Abs: 3.8 10*3/uL (ref 1.7–7.7)
Neutrophils Relative %: 58 %
Platelet Count: 236 10*3/uL (ref 150–400)
RBC: 4.94 MIL/uL (ref 3.87–5.11)
RDW: 14.7 % (ref 11.5–15.5)
WBC Count: 6.5 10*3/uL (ref 4.0–10.5)
nRBC: 0 % (ref 0.0–0.2)

## 2024-12-19 LAB — CMP (CANCER CENTER ONLY)
ALT: 15 U/L (ref 0–44)
AST: 19 U/L (ref 15–41)
Albumin: 4.1 g/dL (ref 3.5–5.0)
Alkaline Phosphatase: 83 U/L (ref 38–126)
Anion gap: 9 (ref 5–15)
BUN: 15 mg/dL (ref 8–23)
CO2: 25 mmol/L (ref 22–32)
Calcium: 9.4 mg/dL (ref 8.9–10.3)
Chloride: 106 mmol/L (ref 98–111)
Creatinine: 0.83 mg/dL (ref 0.44–1.00)
GFR, Estimated: >60 mL/min
Glucose, Bld: 148 mg/dL — ABNORMAL HIGH (ref 70–99)
Potassium: 4.1 mmol/L (ref 3.5–5.1)
Sodium: 141 mmol/L (ref 135–145)
Total Bilirubin: 1.2 mg/dL (ref 0.0–1.2)
Total Protein: 6.5 g/dL (ref 6.5–8.1)

## 2024-12-20 ENCOUNTER — Telehealth: Payer: Self-pay | Admitting: Oncology

## 2024-12-20 NOTE — Telephone Encounter (Signed)
 Patient has been scheduled for follow-up visit per 12/15/2024 LOS.  LVM notifying pt of appt details, provided my direct number to pt if appt changes need to be made.

## 2024-12-22 ENCOUNTER — Other Ambulatory Visit: Payer: Self-pay

## 2024-12-22 ENCOUNTER — Other Ambulatory Visit

## 2024-12-22 ENCOUNTER — Inpatient Hospital Stay: Admitting: Oncology

## 2024-12-22 ENCOUNTER — Other Ambulatory Visit: Payer: Self-pay | Admitting: Oncology

## 2025-01-03 ENCOUNTER — Ambulatory Visit: Admitting: "Endocrinology

## 2025-01-31 ENCOUNTER — Ambulatory Visit (INDEPENDENT_AMBULATORY_CARE_PROVIDER_SITE_OTHER)

## 2025-03-16 ENCOUNTER — Inpatient Hospital Stay

## 2025-03-16 ENCOUNTER — Inpatient Hospital Stay: Admitting: Oncology
